# Patient Record
Sex: Male | Born: 1975 | Race: Black or African American | Hispanic: No | Marital: Single | Smoking: Never smoker
Health system: Southern US, Community
[De-identification: ages and names within clinical notes are randomized; demographics above are authoritative.]

## PROBLEM LIST (undated history)

## (undated) DIAGNOSIS — I10 Essential (primary) hypertension: Secondary | ICD-10-CM

## (undated) DIAGNOSIS — E669 Obesity, unspecified: Secondary | ICD-10-CM

## (undated) HISTORY — PX: TONSILECTOMY, ADENOIDECTOMY, BILATERAL MYRINGOTOMY AND TUBES: SHX2538

## (undated) HISTORY — DX: Obesity, unspecified: E66.9

---

## 2009-03-21 ENCOUNTER — Emergency Department (HOSPITAL_BASED_OUTPATIENT_CLINIC_OR_DEPARTMENT_OTHER): Admission: EM | Admit: 2009-03-21 | Discharge: 2009-03-21 | Payer: Self-pay | Admitting: Emergency Medicine

## 2009-03-25 ENCOUNTER — Emergency Department (HOSPITAL_BASED_OUTPATIENT_CLINIC_OR_DEPARTMENT_OTHER): Admission: EM | Admit: 2009-03-25 | Discharge: 2009-03-26 | Payer: Self-pay | Admitting: Emergency Medicine

## 2009-03-25 ENCOUNTER — Ambulatory Visit: Payer: Self-pay | Admitting: Diagnostic Radiology

## 2009-05-15 ENCOUNTER — Emergency Department (HOSPITAL_BASED_OUTPATIENT_CLINIC_OR_DEPARTMENT_OTHER): Admission: EM | Admit: 2009-05-15 | Discharge: 2009-05-15 | Payer: Self-pay | Admitting: Emergency Medicine

## 2009-05-15 ENCOUNTER — Ambulatory Visit: Payer: Self-pay | Admitting: Diagnostic Radiology

## 2013-02-14 ENCOUNTER — Telehealth: Payer: Self-pay

## 2013-02-14 NOTE — Telephone Encounter (Signed)
Left message for call back Non identifiable  

## 2013-02-14 NOTE — Telephone Encounter (Signed)
Medication List and allergies: entered  90 day supply/mail order: na Local prescriptions: [facility] [address]  Immunizations due: declines flu, tdap approx 8 years ago  A/P:   Updated to FH, PSH and patient hx  To Discuss with Provider: Check up/advised fasting for baseline labs Throat pain

## 2013-02-15 ENCOUNTER — Encounter: Payer: Self-pay | Admitting: Family Medicine

## 2013-02-15 ENCOUNTER — Ambulatory Visit (INDEPENDENT_AMBULATORY_CARE_PROVIDER_SITE_OTHER): Payer: BC Managed Care – PPO | Admitting: Family Medicine

## 2013-02-15 ENCOUNTER — Encounter: Payer: Self-pay | Admitting: General Practice

## 2013-02-15 VITALS — BP 140/84 | HR 88 | Temp 97.6°F | Resp 16 | Ht 62.75 in | Wt 184.5 lb

## 2013-02-15 DIAGNOSIS — R059 Cough, unspecified: Secondary | ICD-10-CM

## 2013-02-15 DIAGNOSIS — R030 Elevated blood-pressure reading, without diagnosis of hypertension: Secondary | ICD-10-CM

## 2013-02-15 DIAGNOSIS — R05 Cough: Secondary | ICD-10-CM

## 2013-02-15 LAB — LIPID PANEL
HDL: 32.3 mg/dL — ABNORMAL LOW (ref >39.00)
LDL Cholesterol: 110 mg/dL — ABNORMAL HIGH (ref 0–99)
Triglycerides: 52 mg/dL (ref 0.0–149.0)
VLDL: 10.4 mg/dL (ref 0.0–40.0)

## 2013-02-15 LAB — CBC WITH DIFFERENTIAL/PLATELET
Eosinophils Absolute: 0.1 10*3/uL (ref 0.0–0.7)
HCT: 41.7 % (ref 39.0–52.0)
Hemoglobin: 14 g/dL (ref 13.0–17.0)
MCV: 87.3 fl (ref 78.0–100.0)
Monocytes Absolute: 0.4 10*3/uL (ref 0.1–1.0)
Neutro Abs: 2.6 10*3/uL (ref 1.4–7.7)
Neutrophils Relative %: 51.8 % (ref 43.0–77.0)
RBC: 4.78 Mil/uL (ref 4.22–5.81)
RDW: 13.3 % (ref 11.5–14.6)
WBC: 5 10*3/uL (ref 4.5–10.5)

## 2013-02-15 LAB — BASIC METABOLIC PANEL
CO2: 26 mEq/L (ref 19–32)
Calcium: 8.6 mg/dL (ref 8.4–10.5)
Chloride: 105 mEq/L (ref 96–112)
Sodium: 138 mEq/L (ref 135–145)

## 2013-02-15 LAB — HEPATIC FUNCTION PANEL
ALT: 26 U/L (ref 0–53)
AST: 25 U/L (ref 0–37)
Bilirubin, Direct: 0 mg/dL (ref 0.0–0.3)

## 2013-02-15 LAB — TSH: TSH: 1.39 u[IU]/mL (ref 0.35–5.50)

## 2013-02-15 MED ORDER — FLUTICASONE PROPIONATE 50 MCG/ACT NA SUSP: 2.0000 | Freq: Every day | NASAL | Status: DC

## 2013-02-15 NOTE — Patient Instructions (Signed)
Schedule your complete physical in 6 months (we'll recheck the blood pressure at this time) We'll notify you of your lab results and make any changes if needed Start Claritin or Zyrtec daily Use the Flonase- 2 sprays each nostril daily- to decrease the drainage, the need to clear your throat and cough Drink plenty of fluids Limit your salt intake Get regular exercise Call with any questions or concerns Welcome!  We're glad to have you!

## 2013-02-15 NOTE — Assessment & Plan Note (Signed)
New.  Suspect this is due to PND.  Start OTC antihistamine and nasal steroid spray.  Reviewed supportive care and red flags that should prompt return.  Pt expressed understanding and is in agreement w/ plan.

## 2013-02-15 NOTE — Progress Notes (Signed)
  Subjective:    Patient ID: Chris Harrington, male    DOB: 20-Nov-1975, 37 y.o.   MRN: [id number]  HPI Pre visit review using our clinic review tool, if applicable. No additional management support is needed unless otherwise documented below in the visit note.  New to establish.  No previous MD.  Elevated BP- new to pt, 'i haven't really monitored it'.  Has not had insurance in 4-5 yrs.  Strong family hx of HTN.  No CP, SOB, HAs, visual changes, edema.  Pt will have fleeting episodes of dizziness/light headed.  No palpitations, no flushing or sweating.  Cough- sxs started 2 yrs ago.  'i always gotta clear my throat'.  Occuring at night too.  Cough is dry.     Review of Systems For ROS see HPI     Objective:   Physical Exam  Vitals reviewed. Constitutional: He appears well-developed and well-nourished. No distress.  HENT:  Head: Normocephalic and atraumatic.  No TTP over sinuses + turbinate edema + PND TMs normal bilaterally  Eyes: Conjunctivae and EOM are normal. Pupils are equal, round, and reactive to light.  Neck: Normal range of motion. Neck supple.  Cardiovascular: Normal rate, regular rhythm and normal heart sounds.   Pulmonary/Chest: Effort normal and breath sounds normal. No respiratory distress. He has no wheezes.  Abdominal: Soft. Bowel sounds are normal. He exhibits no distension. There is no tenderness. There is no rebound.  Musculoskeletal: He exhibits no edema.  Lymphadenopathy:    He has no cervical adenopathy.  Skin: Skin is warm and dry.          Assessment & Plan:

## 2013-02-15 NOTE — Assessment & Plan Note (Signed)
New.  Elevated today but not high enough to start meds.  Given strong family hx will monitor closely.  Encouraged low Na diet, weight loss, regular exercise.  Check labs.

## 2013-08-10 ENCOUNTER — Encounter: Payer: BC Managed Care – PPO | Admitting: Family Medicine

## 2013-08-10 DIAGNOSIS — Z0289 Encounter for other administrative examinations: Secondary | ICD-10-CM

## 2014-01-01 ENCOUNTER — Emergency Department (HOSPITAL_BASED_OUTPATIENT_CLINIC_OR_DEPARTMENT_OTHER)
Admission: EM | Admit: 2014-01-01 | Discharge: 2014-01-01 | Disposition: A | Discharge status: Home / Self Care | Payer: BC Managed Care – PPO | Attending: Emergency Medicine | Admitting: Emergency Medicine

## 2014-01-01 ENCOUNTER — Encounter (HOSPITAL_BASED_OUTPATIENT_CLINIC_OR_DEPARTMENT_OTHER): Payer: Self-pay | Admitting: Emergency Medicine

## 2014-01-01 DIAGNOSIS — R5381 Other malaise: Secondary | ICD-10-CM | POA: Diagnosis not present

## 2014-01-01 DIAGNOSIS — IMO0001 Reserved for inherently not codable concepts without codable children: Secondary | ICD-10-CM

## 2014-01-01 DIAGNOSIS — I10 Essential (primary) hypertension: Secondary | ICD-10-CM | POA: Diagnosis not present

## 2014-01-01 DIAGNOSIS — J3489 Other specified disorders of nose and nasal sinuses: Secondary | ICD-10-CM | POA: Diagnosis present

## 2014-01-01 DIAGNOSIS — J069 Acute upper respiratory infection, unspecified: Secondary | ICD-10-CM | POA: Diagnosis not present

## 2014-01-01 DIAGNOSIS — R030 Elevated blood-pressure reading, without diagnosis of hypertension: Secondary | ICD-10-CM

## 2014-01-01 DIAGNOSIS — IMO0002 Reserved for concepts with insufficient information to code with codable children: Secondary | ICD-10-CM | POA: Insufficient documentation

## 2014-01-01 DIAGNOSIS — R5383 Other fatigue: Secondary | ICD-10-CM

## 2014-01-01 MED ORDER — IBUPROFEN 400 MG PO TABS
600.0000 mg | ORAL_TABLET | Freq: Once | ORAL | Status: AC
  Administered 2014-01-01: 600 mg via ORAL
  Filled 2014-01-01 (×2): qty 1

## 2014-01-01 MED ORDER — BENZONATATE 100 MG PO CAPS: 100.0000 mg | ORAL_CAPSULE | Freq: Three times a day (TID) | ORAL | Status: DC

## 2014-01-01 MED ORDER — FLUTICASONE PROPIONATE 50 MCG/ACT NA SUSP: 2.0000 | Freq: Every day | NASAL | Status: DC

## 2014-01-01 MED ORDER — OXYMETAZOLINE HCL 0.05 % NA SOLN
2.0000 | Freq: Two times a day (BID) | NASAL | Status: DC | PRN
  Administered 2014-01-01: 2 via NASAL
  Filled 2014-01-01: qty 15

## 2014-01-01 NOTE — ED Provider Notes (Signed)
CSN: [id number]     Arrival date & time 01/01/14  0359 History   First MD Initiated Contact with Patient 01/01/14 [phone]     Chief Complaint  Patient presents with  . Nasal Congestion     (Consider location/radiation/quality/duration/timing/severity/associated sxs/prior Treatment) HPI Patient presents with 24 hours of nasal congestion, sinus pressure, sore throat, left ear pain and right eye redness. No fever or chills. No known sick contacts. Patient has no vision disturbance or photophobia. Mild pain behind the right eye. Patient has no neck stiffness. Patient states had a mild cough without sputum production. He has no shortness of breath. No fever chills. No focal weakness or numbness. Patient with no history of elevated blood pressure. Has been taking over-the-counter cold medication with little relief. History reviewed. No pertinent past medical history. Past Surgical History  Procedure Laterality Date  . Tonsilectomy, adenoidectomy, bilateral myringotomy and tubes      pt has not had tubes in ears   Family History  Problem Relation Age of Onset  . Hypertension Mother   . Hypertension Father   . Cancer Paternal Grandmother     passed from unknown cancer  . Hypertension Maternal Grandmother   . Hypertension Maternal Grandfather    History  Substance Use Topics  . Smoking status: Never Smoker   . Smokeless tobacco: Never Used  . Alcohol Use: Yes    Review of Systems  Constitutional: Positive for fatigue. Negative for fever and chills.  HENT: Positive for congestion, ear pain, rhinorrhea, sinus pressure and sore throat.   Eyes: Positive for redness. Negative for photophobia, discharge and visual disturbance.  Respiratory: Positive for cough. Negative for shortness of breath.   Cardiovascular: Negative for chest pain.  Gastrointestinal: Negative for nausea, vomiting and abdominal pain.  Musculoskeletal: Negative for back pain, neck pain and neck stiffness.  Skin: Negative  for rash.  Neurological: Negative for dizziness, weakness, light-headedness, numbness and headaches.  All other systems reviewed and are negative.     Allergies  Review of patient's allergies indicates no known allergies.  Home Medications   Prior to Admission medications   Medication Sig Start Date End Date Taking? Authorizing Provider  benzonatate (TESSALON) 100 MG capsule Take 1 capsule (100 mg total) by mouth every 8 (eight) hours. 01/01/14   Loren Raceravid Edmond Ginsberg, MD  fluticasone (FLONASE) 50 MCG/ACT nasal spray Place 2 sprays into both nostrils daily. 02/15/13   Sheliah HatchKatherine E Tabori, MD  fluticasone (FLONASE) 50 MCG/ACT nasal spray Place 2 sprays into both nostrils daily. 01/01/14   Loren Raceravid Glema Takaki, MD   BP 171/111  Pulse 88  Temp(Src) 99.2 F (37.3 C) (Oral)  Resp 20  Ht 5\' 2"  (1.575 m)  Wt 186 lb (84.369 kg)  BMI 34.01 kg/m2  SpO2 98% Physical Exam  Nursing note and vitals reviewed. Constitutional: He is oriented to person, place, and time. He appears well-developed and well-nourished. No distress.  HENT:  Head: Normocephalic and atraumatic.  Mouth/Throat: Oropharynx is clear and moist.  Bilateral nasal mucosal edema. Diffuse sinus tenderness with percussion. Bilateral TMs without evidence of erythema. No definite tonsillar exudate  Eyes: EOM are normal. Pupils are equal, round, and reactive to light.  Injected right conjunctiva. No foreign bodies. No photophobia.. No consensual photophobia. No hyphema  Neck: Normal range of motion. Neck supple.  No meningismus  Cardiovascular: Normal rate and regular rhythm.   Pulmonary/Chest: Effort normal and breath sounds normal. No respiratory distress. He has no wheezes. He has no rales.  Abdominal: Soft.  Bowel sounds are normal. He exhibits no distension and no mass. There is no tenderness. There is no rebound and no guarding.  Musculoskeletal: Normal range of motion. He exhibits no edema and no tenderness.  Neurological: He is alert  and oriented to person, place, and time.  Moves all extremities without deficit. Sensation is intact. ambulatory  Skin: Skin is warm and dry. No rash noted. No erythema.  Psychiatric: He has a normal mood and affect. His behavior is normal.    ED Course  Procedures (including critical care time) Labs Review Labs Reviewed - No data to display  Imaging Review No results found.   EKG Interpretation None      MDM   Final diagnoses:  URI (upper respiratory infection)  Elevated blood pressure    Likely viral with URI. We'll treat symptomatically. Patient is advised to follow with his primary Dr. regarding his elevated blood pressure. He's been given return precautions and has voice understanding.    Loren Racer, MD 01/01/14 0530

## 2014-01-01 NOTE — ED Notes (Signed)
Pt c/o head congestion with nasal drainage, sore throat, lt ear pain, rt eye redness

## 2014-01-01 NOTE — Discharge Instructions (Signed)
Take medication as prescribed. Drink plenty of fluids. Call and make appointment to followup with your primary Dr. regarding her blood pressure and to assure resolution of your symptoms. If your symptoms worsen, you have difficulty breathing, fever or any concerns return to the emergency department.  Upper Respiratory Infection, Adult An upper respiratory infection (URI) is also known as the common cold. It is often caused by a type of germ (virus). Colds are easily spread (contagious). You can pass it to others by kissing, coughing, sneezing, or drinking out of the same glass. Usually, you get better in 1 or 2 weeks.  HOME CARE   Only take medicine as told by your doctor.  Use a warm mist humidifier or breathe in steam from a hot shower.  Drink enough water and fluids to keep your pee (urine) clear or pale yellow.  Get plenty of rest.  Return to work when your temperature is back to normal or as told by your doctor. You may use a face mask and wash your hands to stop your cold from spreading. GET HELP RIGHT AWAY IF:   After the first few days, you feel you are getting worse.  You have questions about your medicine.  You have chills, shortness of breath, or brown or red spit (mucus).  You have yellow or brown snot (nasal discharge) or pain in the face, especially when you bend forward.  You have a fever, puffy (swollen) neck, pain when you swallow, or white spots in the back of your throat.  You have a bad headache, ear pain, sinus pain, or chest pain.  You have a high-pitched whistling sound when you breathe in and out (wheezing).  You have a lasting cough or cough up blood.  You have sore muscles or a stiff neck. MAKE SURE YOU:   Understand these instructions.  Will watch your condition.  Will get help right away if you are not doing well or get worse. Document Released: 09/08/2007 Document Revised: 06/14/2011 Document Reviewed: 06/27/2013 [facility] Patient Information  2015 [place], [place]. This information is not intended to replace advice given to you by your health care provider. Make sure you discuss any questions you have with your health care provider.  Hypertension Hypertension, commonly called high blood pressure, is when the force of blood pumping through your arteries is too strong. Your arteries are the blood vessels that carry blood from your heart throughout your body. A blood pressure reading consists of a higher number over a lower number, such as 110/72. The higher number (systolic) is the pressure inside your arteries when your heart pumps. The lower number (diastolic) is the pressure inside your arteries when your heart relaxes. Ideally you want your blood pressure below 120/80. Hypertension forces your heart to work harder to pump blood. Your arteries may become narrow or stiff. Having hypertension puts you at risk for heart disease, stroke, and other problems.  RISK FACTORS Some risk factors for high blood pressure are controllable. Others are not.  Risk factors you cannot control include:   Race. You may be at higher risk if you are African American.  Age. Risk increases with age.  Gender. Men are at higher risk than women before age 31 years. After age 6, women are at higher risk than men. Risk factors you can control include:  Not getting enough exercise or physical activity.  Being overweight.  Getting too much fat, sugar, calories, or salt in your diet.  Drinking too much alcohol. SIGNS AND  SYMPTOMS Hypertension does not usually cause signs or symptoms. Extremely high blood pressure (hypertensive crisis) may cause headache, anxiety, shortness of breath, and nosebleed. DIAGNOSIS  To check if you have hypertension, your health care provider will measure your blood pressure while you are seated, with your arm held at the level of your heart. It should be measured at least twice using the same arm. Certain conditions can cause a  difference in blood pressure between your right and left arms. A blood pressure reading that is higher than normal on one occasion does not mean that you need treatment. If one blood pressure reading is high, ask your health care provider about having it checked again. TREATMENT  Treating high blood pressure includes making lifestyle changes and possibly taking medicine. Living a healthy lifestyle can help lower high blood pressure. You may need to change some of your habits. Lifestyle changes may include:  Following the DASH diet. This diet is high in fruits, vegetables, and whole grains. It is low in salt, red meat, and added sugars.  Getting at least 2 hours of brisk physical activity every week.  Losing weight if necessary.  Not smoking.  Limiting alcoholic beverages.  Learning ways to reduce stress. If lifestyle changes are not enough to get your blood pressure under control, your health care provider may prescribe medicine. You may need to take more than one. Work closely with your health care provider to understand the risks and benefits. HOME CARE INSTRUCTIONS  Have your blood pressure rechecked as directed by your health care provider.   Take medicines only as directed by your health care provider. Follow the directions carefully. Blood pressure medicines must be taken as prescribed. The medicine does not work as well when you skip doses. Skipping doses also puts you at risk for problems.   Do not smoke.   Monitor your blood pressure at home as directed by your health care provider. SEEK MEDICAL CARE IF:   You think you are having a reaction to medicines taken.  You have recurrent headaches or feel dizzy.  You have swelling in your ankles.  You have trouble with your vision. SEEK IMMEDIATE MEDICAL CARE IF:  You develop a severe headache or confusion.  You have unusual weakness, numbness, or feel faint.  You have severe chest or abdominal pain.  You vomit  repeatedly.  You have trouble breathing. MAKE SURE YOU:   Understand these instructions.  Will watch your condition.  Will get help right away if you are not doing well or get worse. Document Released: 03/22/2005 Document Revised: 08/06/2013 Document Reviewed: 01/12/2013 [facility] Patient Information 2015 [place]. This information is not intended to replace advice given to you by your health care provider. Make sure you discuss any questions you have with your health care provider.

## 2014-01-09 ENCOUNTER — Encounter: Payer: Self-pay | Admitting: Family Medicine

## 2014-01-09 ENCOUNTER — Ambulatory Visit (INDEPENDENT_AMBULATORY_CARE_PROVIDER_SITE_OTHER): Payer: BC Managed Care – PPO | Admitting: Family Medicine

## 2014-01-09 VITALS — BP 140/80 | HR 82 | Temp 98.2°F | Resp 16 | Wt 190.0 lb

## 2014-01-09 DIAGNOSIS — R030 Elevated blood-pressure reading, without diagnosis of hypertension: Secondary | ICD-10-CM

## 2014-01-09 DIAGNOSIS — IMO0001 Reserved for inherently not codable concepts without codable children: Secondary | ICD-10-CM

## 2014-01-09 NOTE — Patient Instructions (Signed)
Schedule your complete physical in 3 months (we'll recheck your BP at this time) Drink plenty of water Try and limit your salt intake- this includes eating out and processed meats (bacon, pepperoni, sausage, etc) Try and get regular exercise We'll notify you of your lab results and make any changes if needed Call with any questions or concerns Hang in there!!!

## 2014-01-09 NOTE — Progress Notes (Signed)
Pre visit review using our clinic review tool, if applicable. No additional management support is needed unless otherwise documented below in the visit note. 

## 2014-01-09 NOTE — Progress Notes (Signed)
   Subjective:    Patient ID: Chris Harrington, male    DOB: 10/13/1975, 38 y.o.   MRN: [id number]  HPI ER f/u- pt was seen on 9/29 and dx'd w/ URI.  BP was noted to be 171/111.  Pt reports when he was seen in ER was taking OTC Alka-Seltzer cold meds.  Today BP is 140/80.  Pt reports feeling better.  No CP, SOB, + HAs.  No blurry vision, edema.  + family hx of HTN- both mom and dad.  Pt is trying to change diet- limiting soda, increased water.  Pt denies cooking w/ salt but eats pizza 3x/week and eating out regularly.   Review of Systems For ROS see HPI     Objective:   Physical Exam  Vitals reviewed. Constitutional: He is oriented to person, place, and time. He appears well-developed and well-nourished. No distress.  HENT:  Head: Normocephalic and atraumatic.  Eyes: Conjunctivae and EOM are normal. Pupils are equal, round, and reactive to light.  Neck: Normal range of motion. Neck supple. No thyromegaly present.  Cardiovascular: Normal rate, regular rhythm, normal heart sounds and intact distal pulses.   No murmur heard. Pulmonary/Chest: Effort normal and breath sounds normal. No respiratory distress.  Abdominal: Soft. Bowel sounds are normal. He exhibits no distension.  Musculoskeletal: He exhibits no edema.  Lymphadenopathy:    He has no cervical adenopathy.  Neurological: He is alert and oriented to person, place, and time. No cranial nerve deficit.  Skin: Skin is warm and dry.  Psychiatric: He has a normal mood and affect. His behavior is normal.          Assessment & Plan:

## 2014-01-09 NOTE — Assessment & Plan Note (Signed)
Pt's recent readings in ER were likely due to use of OTC cough/cold meds.  Pt does have strong family hx of HTN.  Admits to eating out regularly and not exercising.  Reviewed lifestyle modifications- pt would like to try this prior to starting medication.  Will follow closely and start meds in the future as needed.  Pt expressed understanding and is in agreement w/ plan.

## 2014-01-10 LAB — CBC WITH DIFFERENTIAL/PLATELET
BASOS ABS: 0 10*3/uL (ref 0.0–0.1)
BASOS PCT: 0.6 % (ref 0.0–3.0)
EOS ABS: 0.2 10*3/uL (ref 0.0–0.7)
EOS PCT: 4.7 % (ref 0.0–5.0)
HEMATOCRIT: 41.4 % (ref 39.0–52.0)
Hemoglobin: 13.9 g/dL (ref 13.0–17.0)
LYMPHS PCT: 36.9 % (ref 12.0–46.0)
Lymphs Abs: 1.9 10*3/uL (ref 0.7–4.0)
MCHC: 33.6 g/dL (ref 30.0–36.0)
MCV: 88.4 fl (ref 78.0–100.0)
MONOS PCT: 6.5 % (ref 3.0–12.0)
Monocytes Absolute: 0.3 10*3/uL (ref 0.1–1.0)
Neutro Abs: 2.6 10*3/uL (ref 1.4–7.7)
Neutrophils Relative %: 51.3 % (ref 43.0–77.0)
PLATELETS: 258 10*3/uL (ref 150.0–400.0)
RBC: 4.69 Mil/uL (ref 4.22–5.81)
RDW: 13.1 % (ref 11.5–15.5)
WBC: 5.2 10*3/uL (ref 4.0–10.5)

## 2014-01-10 LAB — TSH: TSH: 1.87 u[IU]/mL (ref 0.35–4.50)

## 2014-01-10 LAB — BASIC METABOLIC PANEL
BUN: 15 mg/dL (ref 6–23)
CO2: 31 mEq/L (ref 19–32)
CREATININE: 1.3 mg/dL (ref 0.4–1.5)
Calcium: 9 mg/dL (ref 8.4–10.5)
Chloride: 103 mEq/L (ref 96–112)
GFR: 84.01 mL/min (ref >60.00)
Glucose, Bld: 93 mg/dL (ref 70–99)
POTASSIUM: 3.8 meq/L (ref 3.5–5.1)
SODIUM: 139 meq/L (ref 135–145)

## 2014-01-11 ENCOUNTER — Encounter: Payer: Self-pay | Admitting: General Practice

## 2014-04-17 ENCOUNTER — Ambulatory Visit: Payer: BLUE CROSS/BLUE SHIELD | Admitting: Family Medicine

## 2014-10-25 ENCOUNTER — Emergency Department (HOSPITAL_BASED_OUTPATIENT_CLINIC_OR_DEPARTMENT_OTHER): Payer: Self-pay

## 2014-10-25 ENCOUNTER — Telehealth: Payer: Self-pay | Admitting: Family Medicine

## 2014-10-25 ENCOUNTER — Emergency Department (HOSPITAL_BASED_OUTPATIENT_CLINIC_OR_DEPARTMENT_OTHER)
Admission: EM | Admit: 2014-10-25 | Discharge: 2014-10-25 | Disposition: A | Discharge status: Home / Self Care | Payer: Self-pay | Attending: Emergency Medicine | Admitting: Emergency Medicine

## 2014-10-25 ENCOUNTER — Encounter (HOSPITAL_BASED_OUTPATIENT_CLINIC_OR_DEPARTMENT_OTHER): Payer: Self-pay

## 2014-10-25 ENCOUNTER — Ambulatory Visit: Payer: Self-pay | Admitting: Family Medicine

## 2014-10-25 DIAGNOSIS — Z7951 Long term (current) use of inhaled steroids: Secondary | ICD-10-CM | POA: Insufficient documentation

## 2014-10-25 DIAGNOSIS — N200 Calculus of kidney: Secondary | ICD-10-CM | POA: Insufficient documentation

## 2014-10-25 DIAGNOSIS — I10 Essential (primary) hypertension: Secondary | ICD-10-CM | POA: Insufficient documentation

## 2014-10-25 DIAGNOSIS — R109 Unspecified abdominal pain: Secondary | ICD-10-CM

## 2014-10-25 HISTORY — DX: Essential (primary) hypertension: I10

## 2014-10-25 LAB — URINALYSIS, ROUTINE W REFLEX MICROSCOPIC
Bilirubin Urine: NEGATIVE
GLUCOSE, UA: NEGATIVE mg/dL
Ketones, ur: NEGATIVE mg/dL
Leukocytes, UA: NEGATIVE
NITRITE: NEGATIVE
PH: 5.5 (ref 5.0–8.0)
PROTEIN: 30 mg/dL — AB
SPECIFIC GRAVITY, URINE: 1.025 (ref 1.005–1.030)
Urobilinogen, UA: 0.2 mg/dL (ref 0.0–1.0)

## 2014-10-25 LAB — URINE MICROSCOPIC-ADD ON

## 2014-10-25 MED ORDER — TAMSULOSIN HCL 0.4 MG PO CAPS: 0.4000 mg | ORAL_CAPSULE | Freq: Every day | ORAL | Status: DC

## 2014-10-25 MED ORDER — OXYCODONE-ACETAMINOPHEN 5-325 MG PO TABS: 1.0000 | ORAL_TABLET | Freq: Four times a day (QID) | ORAL | Status: DC | PRN

## 2014-10-25 MED ORDER — KETOROLAC TROMETHAMINE 60 MG/2ML IM SOLN
60.0000 mg | Freq: Once | INTRAMUSCULAR | Status: AC
  Administered 2014-10-25: 60 mg via INTRAMUSCULAR
  Filled 2014-10-25: qty 2

## 2014-10-25 NOTE — ED Provider Notes (Deleted)
CSN: [id number]     Arrival date & time 10/25/14  2206 History   First MD Initiated Contact with Patient 10/25/14 2218     Chief Complaint  Patient presents with  . Back Pain     (Consider location/radiation/quality/duration/timing/severity/associated sxs/prior Treatment) HPI  Past Medical History  Diagnosis Date  . Hypertension    Past Surgical History  Procedure Laterality Date  . Tonsilectomy, adenoidectomy, bilateral myringotomy and tubes      pt has not had tubes in ears   Family History  Problem Relation Age of Onset  . Hypertension Mother   . Hypertension Father   . Cancer Paternal Grandmother     passed from unknown cancer  . Hypertension Maternal Grandmother   . Hypertension Maternal Grandfather    History  Substance Use Topics  . Smoking status: Never Smoker   . Smokeless tobacco: Never Used  . Alcohol Use: Yes    Review of Systems    Allergies  Review of patient's allergies indicates no known allergies.  Home Medications   Prior to Admission medications   Medication Sig Start Date End Date Taking? Authorizing Provider  benzonatate (TESSALON) 100 MG capsule Take 1 capsule (100 mg total) by mouth every 8 (eight) hours. 01/01/14   Loren Racer, MD  fluticasone (FLONASE) 50 MCG/ACT nasal spray Place 2 sprays into both nostrils daily. 01/01/14   Loren Racer, MD   BP 174/105 mmHg  Pulse 84  Temp(Src) 98.3 F (36.8 C) (Oral)  Resp 20  Ht  (1.575 m)  Wt 175 lb (79.379 kg)  BMI 32.00 kg/m2  SpO2 100% Physical Exam  ED Course  Procedures (including critical care time) Labs Review Labs Reviewed  URINALYSIS, ROUTINE W REFLEX MICROSCOPIC (NOT AT [facility]) - Abnormal; Notable for the following:    Hgb urine dipstick LARGE (*)    Protein, ur 30 (*)    All other components within normal limits  URINE MICROSCOPIC-ADD ON - Abnormal; Notable for the following:    Casts HYALINE CASTS (*)    Crystals URIC ACID CRYSTALS (*)    All other components  within normal limits    Imaging Review No results found.   EKG Interpretation None      MDM   Final diagnoses:  Right flank pain    Urinalysis shows blood and CT scan confirms a moderate sized calculus in the mid ureter with hydronephrosis. He will be treated with Flomax, pain medication, and follow-up with urology if not improving by Monday.    Geoffery Lyons, MD 10/25/14 2306

## 2014-10-25 NOTE — ED Notes (Signed)
Pt c/o lower back/flank pain x3 days with some lower abdominal pain, blood in urine;

## 2014-10-25 NOTE — Discharge Instructions (Signed)
Flomax as prescribed.  Percocet as prescribed as needed for pain.  Call [facility] to schedule a follow-up appointment if you're not improving by Monday. The contact information has been provided in this discharge summary.   Kidney Stones Kidney stones (urolithiasis) are deposits that form inside your kidneys. The intense pain is caused by the stone moving through the urinary tract. When the stone moves, the ureter goes into spasm around the stone. The stone is usually passed in the urine.  CAUSES   A disorder that makes certain neck glands produce too much parathyroid hormone (primary hyperparathyroidism).  A buildup of uric acid crystals, similar to gout in your joints.  Narrowing (stricture) of the ureter.  A kidney obstruction present at birth (congenital obstruction).  Previous surgery on the kidney or ureters.  Numerous kidney infections. SYMPTOMS   Feeling sick to your stomach (nauseous).  Throwing up (vomiting).  Blood in the urine (hematuria).  Pain that usually spreads (radiates) to the groin.  Frequency or urgency of urination. DIAGNOSIS   Taking a history and physical exam.  Blood or urine tests.  CT scan.  Occasionally, an examination of the inside of the urinary bladder (cystoscopy) is performed. TREATMENT   Observation.  Increasing your fluid intake.  Extracorporeal shock wave lithotripsy--This is a noninvasive procedure that uses shock waves to break up kidney stones.  Surgery may be needed if you have severe pain or persistent obstruction. There are various surgical procedures. Most of the procedures are performed with the use of small instruments. Only small incisions are needed to accommodate these instruments, so recovery time is minimized. The size, location, and chemical composition are all important variables that will determine the proper choice of action for you. Talk to your health care provider to better understand your situation so  that you will minimize the risk of injury to yourself and your kidney.  HOME CARE INSTRUCTIONS   Drink enough water and fluids to keep your urine clear or pale yellow. This will help you to pass the stone or stone fragments.  Strain all urine through the provided strainer. Keep all particulate matter and stones for your health care provider to see. The stone causing the pain may be as small as a grain of salt. It is very important to use the strainer each and every time you pass your urine. The collection of your stone will allow your health care provider to analyze it and verify that a stone has actually passed. The stone analysis will often identify what you can do to reduce the incidence of recurrences.  Only take over-the-counter or prescription medicines for pain, discomfort, or fever as directed by your health care provider.  Make a follow-up appointment with your health care provider as directed.  Get follow-up X-rays if required. The absence of pain does not always mean that the stone has passed. It may have only stopped moving. If the urine remains completely obstructed, it can cause loss of kidney function or even complete destruction of the kidney. It is your responsibility to make sure X-rays and follow-ups are completed. Ultrasounds of the kidney can show blockages and the status of the kidney. Ultrasounds are not associated with any radiation and can be performed easily in a matter of minutes. SEEK MEDICAL CARE IF:  You experience pain that is progressive and unresponsive to any pain medicine you have been prescribed. SEEK IMMEDIATE MEDICAL CARE IF:   Pain cannot be controlled with the prescribed medicine.  You have a  fever or shaking chills.  The severity or intensity of pain increases over 18 hours and is not relieved by pain medicine.  You develop a new onset of abdominal pain.  You feel faint or pass out.  You are unable to urinate. MAKE SURE YOU:   Understand these  instructions.  Will watch your condition.  Will get help right away if you are not doing well or get worse. Document Released: 03/22/2005 Document Revised: 11/22/2012 Document Reviewed: 08/23/2012 [facility] Patient Information 2015 [place], [place]. This information is not intended to replace advice given to you by your health care provider. Make sure you discuss any questions you have with your health care provider.

## 2014-10-25 NOTE — ED Provider Notes (Signed)
CSN: [id number]     Arrival date & time 10/25/14  2206 History  This chart was scribed for Geoffery Lyons, MD by Lyndel Safe, ED Scribe. This patient was seen in room MH06/MH06 and the patient's care was started 10:18 PM.   Chief Complaint  Patient presents with  . Back Pain   The history is provided by the patient. No language interpreter was used.   HPI Comments: Chris Harrington is a 39 y.o. male, with no pertinent PMhx, who presents to the Emergency Department complaining of sudden onset, constant, moderate generalized lower back pain that is worse on the right side onset 3 days. He states the pain is exacerbated with any type of movement. 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 He stopped at an Urgent Care in [place] 2 days ago and received a shot of Toradol which he states alleviated his back pain mildly for a day. He additionally notes he received diagnostic imaging and a urinalysis 2 weeks ago at an Urgent Care [place] of state due to hematuria when he was diagnosed with prostatitis and a UTI. Pt was started on an antibiotic course. Denies any attributable injury, dysuria, radiation of pain into BLE, or bowel or bladder incontinence.   Past Medical History  Diagnosis Date  . Hypertension    Past Surgical History  Procedure Laterality Date  . Tonsilectomy, adenoidectomy, bilateral myringotomy and tubes      pt has not had tubes in ears   Family History  Problem Relation Age of Onset  . Hypertension Mother   . Hypertension Father   . Cancer Paternal Grandmother     passed from unknown cancer  . Hypertension Maternal Grandmother   . Hypertension Maternal Grandfather    History  Substance Use Topics  . Smoking status: Never Smoker   . Smokeless tobacco: Never Used  . Alcohol Use: Yes    Review of Systems  Genitourinary: Negative for dysuria.  Musculoskeletal: Positive for back pain. Negative for myalgias and arthralgias.  A complete 10 system review of systems was  obtained and is otherwise negative except at noted in the HPI and PMH.  Allergies  Review of patient's allergies indicates no known allergies.  Home Medications   Prior to Admission medications   Medication Sig Start Date End Date Taking? Authorizing Provider  benzonatate (TESSALON) 100 MG capsule Take 1 capsule (100 mg total) by mouth every 8 (eight) hours. 01/01/14   Loren Racer, MD  fluticasone (FLONASE) 50 MCG/ACT nasal spray Place 2 sprays into both nostrils daily. 01/01/14   Loren Racer, MD   BP 174/105 mmHg  Pulse 84  Temp(Src) 98.3 F (36.8 C) (Oral)  Resp 20  Ht  (1.575 m)  Wt 175 lb (79.379 kg)  BMI 32.00 kg/m2  SpO2 100% Physical Exam  Constitutional: He is oriented to person, place, and time. He appears well-developed and well-nourished. No distress.  HENT:  Head: Normocephalic.  Eyes: Conjunctivae are normal. Right eye exhibits no discharge. Left eye exhibits no discharge. No scleral icterus.  Neck: No JVD present.  Pulmonary/Chest: Effort normal. No respiratory distress.  Musculoskeletal: Normal range of motion. He exhibits tenderness.  TTP in the soft tissues of the right lower lumbar region.  Neurological: He is alert and oriented to person, place, and time. He has normal reflexes. No cranial nerve deficit. Coordination normal.  DTRs are 1+ and symmetrical in BLE; strength is 5/5 in the BLE; walks on heels and toes without difficulty.  Skin: Skin  is warm. No rash noted. No erythema. No pallor.  Psychiatric: He has a normal mood and affect. His behavior is normal.  Nursing note and vitals reviewed.   ED Course  Procedures  DIAGNOSTIC STUDIES: Oxygen Saturation is 100% on RA, normal by my interpretation.    COORDINATION OF CARE: 10:25 PM Discussed treatment plan which includes to order CT renal stone study and urinalysis with pt. Will also order Toradol injection. Pt acknowledges and agrees to plan.   Labs Review Labs Reviewed  URINALYSIS,  ROUTINE W REFLEX MICROSCOPIC (NOT AT [facility]) - Abnormal; Notable for the following:    Hgb urine dipstick LARGE (*)    Protein, ur 30 (*)    All other components within normal limits  URINE MICROSCOPIC-ADD ON - Abnormal; Notable for the following:    Casts HYALINE CASTS (*)    Crystals URIC ACID CRYSTALS (*)    All other components within normal limits    Imaging Review No results found.   EKG Interpretation None      MDM   Final diagnoses:  Right flank pain     Urinalysis shows blood and CT scan confirms a moderate sized calculus in the mid ureter with hydronephrosis. He will be treated with Flomax, pain medication, and follow-up with urology if not improving by Monday.  I personally performed the services described in this documentation, which was scribed in my presence. The recorded information has been reviewed and is accurate.      Geoffery Lyons, MD 11/07/14 1556

## 2014-10-25 NOTE — ED Notes (Signed)
MD at bedside. 

## 2014-10-28 ENCOUNTER — Ambulatory Visit: Payer: BLUE CROSS/BLUE SHIELD | Admitting: Family Medicine

## 2014-11-01 NOTE — Telephone Encounter (Signed)
Pt was no show 10/25/14 2:15pm, acute appt, 3rd no show per history, pt self pay per appt notes.  Do you want me to reschedule? Do you want to charge no show?

## 2014-11-04 NOTE — Telephone Encounter (Signed)
Added comment to chart to not reschedule pt. Do you need to update differently???

## 2014-11-04 NOTE — Telephone Encounter (Signed)
Do not reschedule, ok for NS.

## 2015-02-22 ENCOUNTER — Encounter (HOSPITAL_BASED_OUTPATIENT_CLINIC_OR_DEPARTMENT_OTHER): Payer: Self-pay | Admitting: Emergency Medicine

## 2015-02-22 ENCOUNTER — Emergency Department (HOSPITAL_BASED_OUTPATIENT_CLINIC_OR_DEPARTMENT_OTHER): Payer: Self-pay

## 2015-02-22 ENCOUNTER — Emergency Department (HOSPITAL_BASED_OUTPATIENT_CLINIC_OR_DEPARTMENT_OTHER)
Admission: EM | Admit: 2015-02-22 | Discharge: 2015-02-23 | Disposition: A | Discharge status: Home / Self Care | Payer: Self-pay | Attending: Emergency Medicine | Admitting: Emergency Medicine

## 2015-02-22 DIAGNOSIS — R51 Headache: Secondary | ICD-10-CM | POA: Insufficient documentation

## 2015-02-22 DIAGNOSIS — R200 Anesthesia of skin: Secondary | ICD-10-CM | POA: Insufficient documentation

## 2015-02-22 DIAGNOSIS — I10 Essential (primary) hypertension: Secondary | ICD-10-CM | POA: Insufficient documentation

## 2015-02-22 DIAGNOSIS — Z7951 Long term (current) use of inhaled steroids: Secondary | ICD-10-CM | POA: Insufficient documentation

## 2015-02-22 DIAGNOSIS — Z79899 Other long term (current) drug therapy: Secondary | ICD-10-CM | POA: Insufficient documentation

## 2015-02-22 DIAGNOSIS — R519 Headache, unspecified: Secondary | ICD-10-CM

## 2015-02-22 DIAGNOSIS — R110 Nausea: Secondary | ICD-10-CM | POA: Insufficient documentation

## 2015-02-22 LAB — CBC WITH DIFFERENTIAL/PLATELET
BASOS PCT: 1 %
Basophils Absolute: 0.1 10*3/uL (ref 0.0–0.1)
EOS ABS: 0.2 10*3/uL (ref 0.0–0.7)
Eosinophils Relative: 3 %
HEMATOCRIT: 42.5 % (ref 39.0–52.0)
HEMOGLOBIN: 14.4 g/dL (ref 13.0–17.0)
LYMPHS ABS: 1.9 10*3/uL (ref 0.7–4.0)
LYMPHS PCT: 35 %
MCH: 29 pg (ref 26.0–34.0)
MCHC: 33.9 g/dL (ref 30.0–36.0)
MCV: 85.7 fL (ref 78.0–100.0)
MONOS PCT: 9 %
Monocytes Absolute: 0.5 10*3/uL (ref 0.1–1.0)
Neutro Abs: 2.8 10*3/uL (ref 1.7–7.7)
Neutrophils Relative %: 52 %
Platelets: 249 10*3/uL (ref 150–400)
RBC: 4.96 MIL/uL (ref 4.22–5.81)
RDW: 12.7 % (ref 11.5–15.5)
WBC: 5.3 10*3/uL (ref 4.0–10.5)

## 2015-02-22 MED ORDER — CLONIDINE HCL 0.1 MG PO TABS
0.2000 mg | ORAL_TABLET | Freq: Once | ORAL | Status: AC
  Administered 2015-02-22: 0.2 mg via ORAL
  Filled 2015-02-22: qty 2

## 2015-02-22 NOTE — ED Provider Notes (Signed)
CSN: [id number]   Arrival date & time 02/22/15 2301  History  By signing my name below, I, Chris Harrington, attest that this documentation has been prepared under the direction and in the presence of Geoffery Lyonsouglas Steadman Prosperi, MD. Electronically Signed: Bethel BornBritney Harrington, ED Scribe. 02/22/2015. 11:31 PM.  Chief Complaint  Patient presents with  . Headache    HPI The history is provided by the patient. No language interpreter was used.   Chris Harrington is a 39 y.o. male with history of HTN who presents to the Emergency Department complaining of an atraumatic, constant, gradually worsening, 10/10, left-sided headache with gradual onset this morning around 8 AM. Excedrin provided insufficient pain relief at home. He had a similar but less severe headache in the past associated with hypertension but is not on an antihypertensive. Associated symptoms include tingling in the left arm/hand that is atypical of previous headaches and nausea. Pt denies weakness, vision change, neck pain, and vomiting. He has no history of DM, cardiac disease, or kidney disease.   Past Medical History  Diagnosis Date  . Hypertension     Past Surgical History  Procedure Laterality Date  . Tonsilectomy, adenoidectomy, bilateral myringotomy and tubes      pt has not had tubes in ears    Family History  Problem Relation Age of Onset  . Hypertension Mother   . Hypertension Father   . Cancer Paternal Grandmother     passed from unknown cancer  . Hypertension Maternal Grandmother   . Hypertension Maternal Grandfather     Social History  Substance Use Topics  . Smoking status: Never Smoker   . Smokeless tobacco: Never Used  . Alcohol Use: Yes     Review of Systems  10 Systems reviewed and all are negative for acute change except as noted in the HPI. Home Medications   Prior to Admission medications   Medication Sig Start Date End Date Taking? Authorizing Provider  benzonatate (TESSALON) 100 MG capsule Take 1 capsule (100  mg total) by mouth every 8 (eight) hours. 01/01/14   Loren Raceravid Yelverton, MD  fluticasone (FLONASE) 50 MCG/ACT nasal spray Place 2 sprays into both nostrils daily. 01/01/14   Loren Raceravid Yelverton, MD  oxyCODONE-acetaminophen (PERCOCET) 5-325 MG per tablet Take 1-2 tablets by mouth every 6 (six) hours as needed. 10/25/14   Geoffery Lyonsouglas Arabell Neria, MD  tamsulosin (FLOMAX) 0.4 MG CAPS capsule Take 1 capsule (0.4 mg total) by mouth daily. 10/25/14   Geoffery Lyonsouglas Evea Sheek, MD    Allergies  Review of patient's allergies indicates no known allergies.  Triage Vitals: BP 178/115 mmHg  Pulse 80  Temp(Src) 98.7 F (37.1 C) (Oral)  Resp 18  Ht 5\' 3"  (1.6 m)  Wt 180 lb (81.647 kg)  BMI 31.89 kg/m2  SpO2 100%  Physical Exam  Constitutional: He is oriented to person, place, and time. He appears well-developed and well-nourished.  HENT:  Head: Normocephalic and atraumatic.  Eyes: EOM are normal. Pupils are equal, round, and reactive to light.  No papilledema.   Neck: Normal range of motion.  Cardiovascular: Normal rate, regular rhythm, normal heart sounds and intact distal pulses.   Pulmonary/Chest: Effort normal and breath sounds normal. No respiratory distress.  Abdominal: Soft. He exhibits no distension. There is no tenderness.  Musculoskeletal: Normal range of motion.  Neurological: He is alert and oriented to person, place, and time. No cranial nerve deficit. He exhibits normal muscle tone. Coordination normal.  Skin: Skin is warm and dry.  Psychiatric: He has a normal  mood and affect. Judgment normal.  Nursing note and vitals reviewed.   ED Course  Procedures   DIAGNOSTIC STUDIES: Oxygen Saturation is 100% on RA, normal by my interpretation.    COORDINATION OF CARE: 11:28 PM Discussed treatment plan which includes CT head, lab work, and clonidine with pt at bedside and pt agreed to plan.  Labs Reviewed  BASIC METABOLIC PANEL  CBC WITH DIFFERENTIAL/PLATELET    Imaging Review No results found.  I personally  reviewed and evaluated these images and lab results as a part of my medical decision-making.   MDM   Final diagnoses:  None    Patient presents with complaints of elevated blood pressure, headache, and left arm numbness. His neurologic exam is nonfocal and head CT is negative. His laboratory studies are unremarkable. He was given a dose of clonidine and his blood pressure has improved. He is also feeling better. He has been seen by his primary Dr. for his blood pressure, however no medications have been started to this point. I will prescribe Norvasc for him. He is to start this if his blood pressures remain high. He is to also keep a record of his blood pressures which he can take it his doctors follow-up appointment.  I personally performed the services described in this documentation, which was scribed in my presence. The recorded information has been reviewed and is accurate.       Geoffery Lyons, MD 02/23/15 [phone]

## 2015-02-22 NOTE — ED Notes (Signed)
Patient drove himself to the ER today.

## 2015-02-22 NOTE — ED Notes (Signed)
Patient states that he has a HA starting this am, it has become progressively worse throughout the day and now his left arm is going numb.

## 2015-02-23 LAB — BASIC METABOLIC PANEL
Anion gap: 6 (ref 5–15)
BUN: 16 mg/dL (ref 6–20)
CHLORIDE: 105 mmol/L (ref 101–111)
CO2: 27 mmol/L (ref 22–32)
Calcium: 8.8 mg/dL — ABNORMAL LOW (ref 8.9–10.3)
Creatinine, Ser: 1.45 mg/dL — ABNORMAL HIGH (ref 0.61–1.24)
GFR calc Af Amer: >60 mL/min (ref >60)
GFR calc non Af Amer: >60 mL/min (ref >60)
Glucose, Bld: 110 mg/dL — ABNORMAL HIGH (ref 65–99)
POTASSIUM: 3.1 mmol/L — AB (ref 3.5–5.1)
Sodium: 138 mmol/L (ref 135–145)

## 2015-02-23 MED ORDER — AMLODIPINE BESYLATE 10 MG PO TABS
10.0000 mg | ORAL_TABLET | Freq: Every day | ORAL | Status: DC
Start: 2015-02-23 — End: 2019-07-31

## 2015-02-23 NOTE — Discharge Instructions (Signed)
Keep a record of your blood pressure regularly for the next several days. If your blood pressures remain elevated, begin the prescription for Norvasc you have been provided here today.  Keep a record of your blood pressures to take to your next doctor's appointment, ideally in the next 1 week.   General Headache Without Cause A headache is pain or discomfort felt around the head or neck area. The specific cause of a headache may not be found. There are many causes and types of headaches. A few common ones are:  Tension headaches.  Migraine headaches.  Cluster headaches.  Chronic daily headaches. HOME CARE INSTRUCTIONS  Watch your condition for any changes. Take these steps to help with your condition: Managing Pain  Take over-the-counter and prescription medicines only as told by your health care provider.  Lie down in a dark, quiet room when you have a headache.  If directed, apply ice to the head and neck area:  Put ice in a plastic bag.  Place a towel between your skin and the bag.  Leave the ice on for 20 minutes, 2-3 times per day.  Use a heating pad or hot shower to apply heat to the head and neck area as told by your health care provider.  Keep lights dim if bright lights bother you or make your headaches worse. Eating and Drinking  Eat meals on a regular schedule.  Limit alcohol use.  Decrease the amount of caffeine you drink, or stop drinking caffeine. General Instructions  Keep all follow-up visits as told by your health care provider. This is important.  Keep a headache journal to help find out what may trigger your headaches. For example, write down:  What you eat and drink.  How much sleep you get.  Any change to your diet or medicines.  Try massage or other relaxation techniques.  Limit stress.  Sit up straight, and do not tense your muscles.  Do not use tobacco products, including cigarettes, chewing tobacco, or e-cigarettes. If you need help  quitting, ask your health care provider.  Exercise regularly as told by your health care provider.  Sleep on a regular schedule. Get 7-9 hours of sleep, or the amount recommended by your health care provider. SEEK MEDICAL CARE IF:   Your symptoms are not helped by medicine.  You have a headache that is different from the usual headache.  You have nausea or you vomit.  You have a fever. SEEK IMMEDIATE MEDICAL CARE IF:   Your headache becomes severe.  You have repeated vomiting.  You have a stiff neck.  You have a loss of vision.  You have problems with speech.  You have pain in the eye or ear.  You have muscular weakness or loss of muscle control.  You lose your balance or have trouble walking.  You feel faint or pass out.  You have confusion.   This information is not intended to replace advice given to you by your health care provider. Make sure you discuss any questions you have with your health care provider.   Document Released: 03/22/2005 Document Revised: 12/11/2014 Document Reviewed: 07/15/2014 Elsevier Interactive Patient Education 2016 [employer].  Hypertension Hypertension, commonly called high blood pressure, is when the force of blood pumping through your arteries is too strong. Your arteries are the blood vessels that carry blood from your heart throughout your body. A blood pressure reading consists of a higher number over a lower number, such as 110/72. The higher number (  systolic) is the pressure inside your arteries when your heart pumps. The lower number (diastolic) is the pressure inside your arteries when your heart relaxes. Ideally you want your blood pressure below 120/80. Hypertension forces your heart to work harder to pump blood. Your arteries may become narrow or stiff. Having untreated or uncontrolled hypertension can cause heart attack, stroke, kidney disease, and other problems. RISK FACTORS Some risk factors for high blood pressure are  controllable. Others are not.  Risk factors you cannot control include:   Race. You may be at higher risk if you are African American.  Age. Risk increases with age.  Gender. Men are at higher risk than women before age 70 years. After age 22, women are at higher risk than men. Risk factors you can control include:  Not getting enough exercise or physical activity.  Being overweight.  Getting too much fat, sugar, calories, or salt in your diet.  Drinking too much alcohol. SIGNS AND SYMPTOMS Hypertension does not usually cause signs or symptoms. Extremely high blood pressure (hypertensive crisis) may cause headache, anxiety, shortness of breath, and nosebleed. DIAGNOSIS To check if you have hypertension, your health care provider will measure your blood pressure while you are seated, with your arm held at the level of your heart. It should be measured at least twice using the same arm. Certain conditions can cause a difference in blood pressure between your right and left arms. A blood pressure reading that is higher than normal on one occasion does not mean that you need treatment. If it is not clear whether you have high blood pressure, you may be asked to return on a different day to have your blood pressure checked again. Or, you may be asked to monitor your blood pressure at home for 1 or more weeks. TREATMENT Treating high blood pressure includes making lifestyle changes and possibly taking medicine. Living a healthy lifestyle can help lower high blood pressure. You may need to change some of your habits. Lifestyle changes may include:  Following the DASH diet. This diet is high in fruits, vegetables, and whole grains. It is low in salt, red meat, and added sugars.  Keep your sodium intake below 2,300 mg per day.  Getting at least 30-45 minutes of aerobic exercise at least 4 times per week.  Losing weight if necessary.  Not smoking.  Limiting alcoholic beverages.  Learning  ways to reduce stress. Your health care provider may prescribe medicine if lifestyle changes are not enough to get your blood pressure under control, and if one of the following is true:  You are 11-35 years of age and your systolic blood pressure is above 140.  You are 50 years of age or older, and your systolic blood pressure is above 150.  Your diastolic blood pressure is above 90.  You have diabetes, and your systolic blood pressure is over 140 or your diastolic blood pressure is over 90.  You have kidney disease and your blood pressure is above 140/90.  You have heart disease and your blood pressure is above 140/90. Your personal target blood pressure may vary depending on your medical conditions, your age, and other factors. HOME CARE INSTRUCTIONS  Have your blood pressure rechecked as directed by your health care provider.   Take medicines only as directed by your health care provider. Follow the directions carefully. Blood pressure medicines must be taken as prescribed. The medicine does not work as well when you skip doses. Skipping doses also puts you  at risk for problems.  Do not smoke.   Monitor your blood pressure at home as directed by your health care provider. SEEK MEDICAL CARE IF:   You think you are having a reaction to medicines taken.  You have recurrent headaches or feel dizzy.  You have swelling in your ankles.  You have trouble with your vision. SEEK IMMEDIATE MEDICAL CARE IF:  You develop a severe headache or confusion.  You have unusual weakness, numbness, or feel faint.  You have severe chest or abdominal pain.  You vomit repeatedly.  You have trouble breathing. MAKE SURE YOU:   Understand these instructions.  Will watch your condition.  Will get help right away if you are not doing well or get worse.   This information is not intended to replace advice given to you by your health care provider. Make sure you discuss any questions you  have with your health care provider.   Document Released: 03/22/2005 Document Revised: 08/06/2014 Document Reviewed: 01/12/2013 Elsevier Interactive Patient Education [employer]2016 [employer] Inc.

## 2018-03-06 ENCOUNTER — Other Ambulatory Visit: Payer: Self-pay

## 2018-03-06 ENCOUNTER — Emergency Department (HOSPITAL_BASED_OUTPATIENT_CLINIC_OR_DEPARTMENT_OTHER): Payer: Self-pay

## 2018-03-06 ENCOUNTER — Encounter (HOSPITAL_BASED_OUTPATIENT_CLINIC_OR_DEPARTMENT_OTHER): Payer: Self-pay | Admitting: Emergency Medicine

## 2018-03-06 ENCOUNTER — Emergency Department (HOSPITAL_BASED_OUTPATIENT_CLINIC_OR_DEPARTMENT_OTHER)
Admission: EM | Admit: 2018-03-06 | Discharge: 2018-03-06 | Disposition: A | Discharge status: Home / Self Care | Payer: Self-pay | Attending: Emergency Medicine | Admitting: Emergency Medicine

## 2018-03-06 DIAGNOSIS — I10 Essential (primary) hypertension: Secondary | ICD-10-CM | POA: Insufficient documentation

## 2018-03-06 DIAGNOSIS — R519 Headache, unspecified: Secondary | ICD-10-CM

## 2018-03-06 DIAGNOSIS — R51 Headache: Secondary | ICD-10-CM | POA: Insufficient documentation

## 2018-03-06 DIAGNOSIS — Z79899 Other long term (current) drug therapy: Secondary | ICD-10-CM | POA: Insufficient documentation

## 2018-03-06 LAB — CBC WITH DIFFERENTIAL/PLATELET
ABS IMMATURE GRANULOCYTES: 0.02 10*3/uL (ref 0.00–0.07)
BASOS ABS: 0.1 10*3/uL (ref 0.0–0.1)
BASOS PCT: 1 %
EOS ABS: 0.1 10*3/uL (ref 0.0–0.5)
Eosinophils Relative: 1 %
HCT: 43.2 % (ref 39.0–52.0)
Hemoglobin: 14.2 g/dL (ref 13.0–17.0)
IMMATURE GRANULOCYTES: 0 %
Lymphocytes Relative: 15 %
Lymphs Abs: 1.1 10*3/uL (ref 0.7–4.0)
MCH: 28.3 pg (ref 26.0–34.0)
MCHC: 32.9 g/dL (ref 30.0–36.0)
MCV: 86.1 fL (ref 80.0–100.0)
Monocytes Absolute: 0.4 10*3/uL (ref 0.1–1.0)
Monocytes Relative: 5 %
NEUTROS ABS: 5.7 10*3/uL (ref 1.7–7.7)
NEUTROS PCT: 78 %
NRBC: 0 % (ref 0.0–0.2)
Platelets: 281 10*3/uL (ref 150–400)
RBC: 5.02 MIL/uL (ref 4.22–5.81)
RDW: 13.3 % (ref 11.5–15.5)
WBC: 7.4 10*3/uL (ref 4.0–10.5)

## 2018-03-06 LAB — BASIC METABOLIC PANEL
ANION GAP: 7 (ref 5–15)
BUN: 12 mg/dL (ref 6–20)
CALCIUM: 8.6 mg/dL — AB (ref 8.9–10.3)
CO2: 26 mmol/L (ref 22–32)
Chloride: 104 mmol/L (ref 98–111)
Creatinine, Ser: 1.25 mg/dL — ABNORMAL HIGH (ref 0.61–1.24)
GFR calc Af Amer: >60 mL/min (ref >60)
GLUCOSE: 118 mg/dL — AB (ref 70–99)
POTASSIUM: 3 mmol/L — AB (ref 3.5–5.1)
SODIUM: 137 mmol/L (ref 135–145)

## 2018-03-06 LAB — TROPONIN I

## 2018-03-06 MED ORDER — PROCHLORPERAZINE MALEATE 10 MG PO TABS
10.0000 mg | ORAL_TABLET | Freq: Once | ORAL | Status: AC
  Administered 2018-03-06: 10 mg via ORAL
  Filled 2018-03-06: qty 1

## 2018-03-06 MED ORDER — DIPHENHYDRAMINE HCL 25 MG PO CAPS
25.0000 mg | ORAL_CAPSULE | Freq: Once | ORAL | Status: AC
  Administered 2018-03-06: 25 mg via ORAL
  Filled 2018-03-06: qty 1

## 2018-03-06 MED ORDER — ACETAMINOPHEN 325 MG PO TABS
650.0000 mg | ORAL_TABLET | Freq: Once | ORAL | Status: AC
  Administered 2018-03-06: 650 mg via ORAL
  Filled 2018-03-06: qty 2

## 2018-03-06 NOTE — ED Triage Notes (Signed)
Pt has headache since yesterday.  Pt states the pain is worse.  Now pt c/o nausea.  Pt has been off his bp medications for a few days but has re-started his meds this am.

## 2018-03-06 NOTE — ED Provider Notes (Signed)
[facility] EMERGENCY DEPARTMENT Provider Note   CSN: [id number] Arrival date & time: 03/06/18  0844     History   Chief Complaint Chief Complaint  Patient presents with  . Headache    HPI Chris Harrington is a 42 y.o. male.  The history is provided by the patient.  Headache   This is a new problem. The current episode started 2 days ago. The problem occurs hourly. Progression since onset: waxing and waning. The headache is associated with nothing. The pain is located in the frontal region. The quality of the pain is described as dull. The pain is at a severity of 3/10. The pain is mild. The pain does not radiate. Associated symptoms include malaise/fatigue, chest pressure and nausea. Pertinent negatives include no anorexia, no fever, no near-syncope, no orthopnea, no palpitations, no syncope, no shortness of breath and no vomiting. He has tried nothing for the symptoms. The treatment provided no relief.    Past Medical History:  Diagnosis Date  . Hypertension     Patient Active Problem List   Diagnosis Date Noted  . Elevated BP 02/15/2013  . Cough 02/15/2013    Past Surgical History:  Procedure Laterality Date  . TONSILECTOMY, ADENOIDECTOMY, BILATERAL MYRINGOTOMY AND TUBES     pt has not had tubes in ears        Home Medications    Prior to Admission medications   Medication Sig Start Date End Date Taking? Authorizing Provider  amLODipine (NORVASC) 10 MG tablet Take 1 tablet (10 mg total) by mouth daily. 02/23/15   Geoffery Lyonselo, Douglas, MD    Family History Family History  Problem Relation Age of Onset  . Hypertension Mother   . Hypertension Father   . Cancer Paternal Grandmother        passed from unknown cancer  . Hypertension Maternal Grandmother   . Hypertension Maternal Grandfather     Social History Social History   Tobacco Use  . Smoking status: Never Smoker  . Smokeless tobacco: Never Used  Substance Use Topics  . Alcohol use: Yes  . Drug  use: No     Allergies   Patient has no known allergies.   Review of Systems Review of Systems  Constitutional: Positive for malaise/fatigue. Negative for chills and fever.  HENT: Negative for ear pain and sore throat.   Eyes: Negative for pain and visual disturbance.  Respiratory: Negative for cough and shortness of breath.   Cardiovascular: Positive for chest pain. Negative for palpitations, orthopnea, syncope and near-syncope.  Gastrointestinal: Positive for nausea. Negative for abdominal pain, anorexia and vomiting.  Genitourinary: Negative for dysuria and hematuria.  Musculoskeletal: Positive for myalgias. Negative for arthralgias and back pain.  Skin: Negative for color change and rash.  Neurological: Positive for headaches. Negative for seizures and syncope.  All other systems reviewed and are negative.    Physical Exam Updated Vital Signs  ED Triage Vitals  Enc Vitals Group     BP 03/06/18 0853 (!) 143/99     Pulse Rate 03/06/18 0853 84     Resp 03/06/18 0853 16     Temp 03/06/18 0853 98.3 F (36.8 C)     Temp Source 03/06/18 0853 Oral     SpO2 03/06/18 0853 99 %     Weight 03/06/18 0900 190 lb (86.2 kg)     Height 03/06/18 0900 5\' 3"  (1.6 m)     Head Circumference --      Peak Flow --  Pain Score 03/06/18 0859 10     Pain Loc --      Pain Edu? --      Excl. in GC? --     Physical Exam  Constitutional: He is oriented to person, place, and time. He appears well-developed and well-nourished.  HENT:  Head: Normocephalic and atraumatic.  Eyes: Pupils are equal, round, and reactive to light. Conjunctivae and EOM are normal. Right eye exhibits normal extraocular motion and no nystagmus. Left eye exhibits normal extraocular motion and no nystagmus. Right pupil is round and reactive. Left pupil is round and reactive.  Neck: Normal range of motion. Neck supple.  Cardiovascular: Normal rate and regular rhythm.  No murmur heard. Pulmonary/Chest: Effort normal and  breath sounds normal. No respiratory distress.  Abdominal: Soft. There is no tenderness.  Musculoskeletal: He exhibits no edema.  Neurological: He is alert and oriented to person, place, and time. He has normal strength. He is not disoriented. No cranial nerve deficit or sensory deficit. Coordination normal.  5+/5 strength and normal sensation throughout, no drift, normal finger to nose finger  Skin: Skin is warm and dry. Capillary refill takes less than 2 seconds.  Psychiatric: He has a normal mood and affect.  Nursing note and vitals reviewed.    ED Treatments / Results  Labs (all labs ordered are listed, but only abnormal results are displayed) Labs Reviewed  BASIC METABOLIC PANEL - Abnormal; Notable for the following components:      Result Value   Potassium 3.0 (*)    Glucose, Bld 118 (*)    Creatinine, Ser 1.25 (*)    Calcium 8.6 (*)    All other components within normal limits  CBC WITH DIFFERENTIAL/PLATELET  TROPONIN I    EKG  Normal sinus rhythm.  No signs of ischemic changes.  Normal intervals.  Rate of 73  Radiology Dg Chest 2 View  Result Date: 03/06/2018 CLINICAL DATA:  Chest pain EXAM: CHEST - 2 VIEW COMPARISON:  05/15/2009 FINDINGS: The heart size and mediastinal contours are within normal limits. Both lungs are clear. The visualized skeletal structures are unremarkable. IMPRESSION: No active cardiopulmonary disease. Electronically Signed   By: Alcide Clever M.D.   On: 03/06/2018 09:19    Procedures Procedures (including critical care time)  Medications Ordered in ED Medications  prochlorperazine (COMPAZINE) tablet 10 mg (has no administration in time range)  diphenhydrAMINE (BENADRYL) capsule 25 mg (has no administration in time range)  acetaminophen (TYLENOL) tablet 650 mg (650 mg Oral Given 03/06/18 0939)     Initial Impression / Assessment and Plan / ED Course  I have reviewed the triage vital signs and the nursing notes.  Pertinent labs & imaging  results that were available during my care of the patient were reviewed by me and considered in my medical decision making (see chart for details).     Chris Harrington is a 42 year old male with history of hypertension who presents to the ED with multiple complaints.  Patient with normal vitals.  No fever.  Patient with mild headache, body aches.  States some intermittent chest pain, nausea.  Patient overall well-appearing.  Normal neurological exam.  Patient appears to likely have viral process.  EKG shows sinus rhythm.  No signs of ischemic changes.  Troponin within normal limits.  Doubt cardiac process.  Chest x-ray showed no signs of pneumonia, pneumothorax, pleural effusion.  Patient is PERC negative and doubt PE.  Patient with normal neurological exam.  Improvement following headache cocktail.  Suspect headache from viral process or migraine. No need for head CT at this time.  Patient with no significant anemia, electrolyte abnormality, kidney injury.  Recommend follow-up with primary care doctor and discharged from ED in good condition.  This chart was dictated using voice recognition software.  Despite best efforts to proofread,  errors can occur which can change the documentation meaning.   Final Clinical Impressions(s) / ED Diagnoses   Final diagnoses:  Nonintractable headache, unspecified chronicity pattern, unspecified headache type    ED Discharge Orders    None       Virgina Norfolk, DO 03/06/18 1056

## 2018-03-06 NOTE — Discharge Instructions (Addendum)
Continue tylenol and motrin at home.

## 2019-03-24 ENCOUNTER — Encounter (HOSPITAL_BASED_OUTPATIENT_CLINIC_OR_DEPARTMENT_OTHER): Payer: Self-pay | Admitting: Emergency Medicine

## 2019-03-24 ENCOUNTER — Other Ambulatory Visit: Payer: Self-pay

## 2019-03-24 ENCOUNTER — Emergency Department (HOSPITAL_BASED_OUTPATIENT_CLINIC_OR_DEPARTMENT_OTHER)
Admission: EM | Admit: 2019-03-24 | Discharge: 2019-03-24 | Disposition: A | Discharge status: Home / Self Care | Payer: Self-pay | Attending: Emergency Medicine | Admitting: Emergency Medicine

## 2019-03-24 DIAGNOSIS — E876 Hypokalemia: Secondary | ICD-10-CM | POA: Insufficient documentation

## 2019-03-24 DIAGNOSIS — U071 COVID-19: Secondary | ICD-10-CM | POA: Insufficient documentation

## 2019-03-24 DIAGNOSIS — I10 Essential (primary) hypertension: Secondary | ICD-10-CM | POA: Insufficient documentation

## 2019-03-24 DIAGNOSIS — Z79899 Other long term (current) drug therapy: Secondary | ICD-10-CM | POA: Insufficient documentation

## 2019-03-24 DIAGNOSIS — B349 Viral infection, unspecified: Secondary | ICD-10-CM | POA: Insufficient documentation

## 2019-03-24 LAB — BASIC METABOLIC PANEL
Anion gap: 10 (ref 5–15)
BUN: 11 mg/dL (ref 6–20)
CO2: 27 mmol/L (ref 22–32)
Calcium: 8.2 mg/dL — ABNORMAL LOW (ref 8.9–10.3)
Chloride: 100 mmol/L (ref 98–111)
Creatinine, Ser: 1.24 mg/dL (ref 0.61–1.24)
GFR calc Af Amer: >60 mL/min (ref >60)
GFR calc non Af Amer: >60 mL/min (ref >60)
Glucose, Bld: 160 mg/dL — ABNORMAL HIGH (ref 70–99)
Potassium: 2.8 mmol/L — ABNORMAL LOW (ref 3.5–5.1)
Sodium: 137 mmol/L (ref 135–145)

## 2019-03-24 LAB — MAGNESIUM: Magnesium: 2.2 mg/dL (ref 1.7–2.4)

## 2019-03-24 MED ORDER — POTASSIUM CHLORIDE CRYS ER 20 MEQ PO TBCR: 20.0000 meq | EXTENDED_RELEASE_TABLET | Freq: Every day | ORAL | 0 refills | Status: DC

## 2019-03-24 MED ORDER — POTASSIUM CHLORIDE CRYS ER 20 MEQ PO TBCR
40.0000 meq | EXTENDED_RELEASE_TABLET | Freq: Once | ORAL | Status: AC
  Administered 2019-03-24: 17:00:00 40 meq via ORAL
  Filled 2019-03-24: qty 2

## 2019-03-24 MED ORDER — POTASSIUM CHLORIDE 10 MEQ/100ML IV SOLN
10.0000 meq | Freq: Once | INTRAVENOUS | Status: AC
  Administered 2019-03-24: 10 meq via INTRAVENOUS
  Filled 2019-03-24: qty 100

## 2019-03-24 NOTE — ED Triage Notes (Addendum)
Pt c/o cough, fever, sob, generalized body aches x 1 week. Pt reports palpitations x 2 days. Pt also c/o nausea  X 1 week, denies emesis, denies diarrhea at this time. Pt reports exposure to Covid on sunday

## 2019-03-24 NOTE — Discharge Instructions (Signed)
Your potassium was low today.  We gave you potassium in the ER.  This may be explaining why you are having these irregular skipped heartbeats.  I prescribed potassium pills for approximately 3 weeks.  I advised you to take these pills for a minimum of 1 week.  If you are able to, you should try to eat some potassium containing foods every day.  You are able to eat these foods every day, you do not need to continue taking the pills for the full 3 weeks.  It is very important that you follow-up with a primary care doctor and have your potassium level rechecked sometime in the next month.  Your Covid test is pending.  You should have results in about 2 days.  In the meantime, you should quarantine at home and assume that you are Covid positive until you know your results.

## 2019-03-24 NOTE — ED Provider Notes (Signed)
[facility] EMERGENCY DEPARTMENT Provider Note   CSN: [id number] Arrival date & time: 03/24/19  1258     History Chief Complaint  Patient presents with  . Palpitations    COVID symptoms    Chris Harrington is a 43 y.o. male with a history of hypertension presenting to the ED with a viral-like symptoms.  The patient reports 1 week of myalgias, headache, fatigue, nausea, diminished appetite, loss of taste and smell.  He also reports some mild shortness of breath and dyspnea.  He says he is felt very weak.  He feels like he had some fevers and chills but did not check a temperature at home.  He also reports that for the past 2 days been having palpitations.  These keep him up at night.  He said this is the reason he said to come to the emergency department.  Patient reports that he was at his grandmother's house earlier this week, and she recently was tested positive for COVID-19.  He does not smoke cigarettes.  He is a light social drinker.  Does not use any illicit drugs.  He does report that he drinks soda nearly daily.  He also drinks Gatorade.  No coffee or energy drinks.  He has no personal history of MI or cardiac disease.  Family history notable only for congestive heart failure in his mother.  No hemoptysis or asymmetric LE edema. Patient denies personal or family history of DVT or PE. No recent hormone use (including OCP); travel for >6 hours; prolonged immobilization for greater than 3 days; surgeries or trauma in the last 4 weeks; or malignancy with treatment within 6 months.   HPI     Past Medical History:  Diagnosis Date  . Hypertension     Patient Active Problem List   Diagnosis Date Noted  . Elevated BP 02/15/2013  . Cough 02/15/2013    Past Surgical History:  Procedure Laterality Date  . TONSILECTOMY, ADENOIDECTOMY, BILATERAL MYRINGOTOMY AND TUBES     pt has not had tubes in ears       Family History  Problem Relation Age of Onset  .  Hypertension Mother   . Hypertension Father   . Cancer Paternal Grandmother        passed from unknown cancer  . Hypertension Maternal Grandmother   . Hypertension Maternal Grandfather     Social History   Tobacco Use  . Smoking status: Never Smoker  . Smokeless tobacco: Never Used  Substance Use Topics  . Alcohol use: Yes  . Drug use: No    Home Medications Prior to Admission medications   Medication Sig Start Date End Date Taking? Authorizing Provider  amLODipine (NORVASC) 10 MG tablet Take 1 tablet (10 mg total) by mouth daily. 02/23/15   Geoffery Lyons, MD  potassium chloride SA (KLOR-CON) 20 MEQ tablet Take 1 tablet (20 mEq total) by mouth daily for 21 days. 03/25/19 04/15/19  Terald Sleeper, MD    Allergies    Patient has no known allergies.  Review of Systems   Review of Systems  Constitutional: Positive for appetite change, chills, fatigue and fever.  HENT: Positive for congestion and sneezing.   Eyes: Negative for pain and visual disturbance.  Respiratory: Positive for shortness of breath. Negative for cough.   Cardiovascular: Positive for palpitations. Negative for chest pain.  Gastrointestinal: Negative for abdominal pain and vomiting.  Musculoskeletal: Positive for arthralgias and myalgias.  Skin: Negative for color change and rash.  Neurological: Positive  for headaches. Negative for syncope.  Psychiatric/Behavioral: Negative for agitation and confusion.  All other systems reviewed and are negative.   Physical Exam Updated Vital Signs BP 134/87   Pulse 81   Temp 98.8 F (37.1 C) (Oral)   Resp 20   Ht 5\' 3"  (1.6 m)   Wt 90.7 kg   SpO2 97%   BMI 35.43 kg/m   Physical Exam Vitals and nursing note reviewed.  Constitutional:      Appearance: He is well-developed.  HENT:     Head: Normocephalic and atraumatic.  Eyes:     Conjunctiva/sclera: Conjunctivae normal.  Cardiovascular:     Rate and Rhythm: Normal rate and regular rhythm.     Pulses:  Normal pulses.     Heart sounds: No murmur.  Pulmonary:     Effort: Pulmonary effort is normal. No respiratory distress.     Breath sounds: Normal breath sounds.  Abdominal:     Palpations: Abdomen is soft.     Tenderness: There is no abdominal tenderness.  Musculoskeletal:     Cervical back: Neck supple.  Skin:    General: Skin is warm and dry.  Neurological:     General: No focal deficit present.     Mental Status: He is alert and oriented to person, place, and time.  Psychiatric:        Mood and Affect: Mood normal.        Behavior: Behavior normal.     ED Results / Procedures / Treatments   Labs (all labs ordered are listed, but only abnormal results are displayed) Labs Reviewed  BASIC METABOLIC PANEL - Abnormal; Notable for the following components:      Result Value   Potassium 2.8 (*)    Glucose, Bld 160 (*)    Calcium 8.2 (*)    All other components within normal limits  NOVEL CORONAVIRUS, NAA (HOSP ORDER, SEND-OUT TO REF LAB; TAT 18-24 HRS)  MAGNESIUM    EKG None  Radiology No results found.  Procedures .Critical Care Performed by: Wyvonnia Dusky, MD Authorized by: Wyvonnia Dusky, MD   Critical care provider statement:    Critical care time (minutes):  30   Critical care was necessary to treat or prevent imminent or life-threatening deterioration of the following conditions:  Metabolic crisis   Critical care was time spent personally by me on the following activities:  Discussions with consultants, evaluation of patient's response to treatment, examination of patient, ordering and performing treatments and interventions, ordering and review of laboratory studies, ordering and review of radiographic studies, pulse oximetry, re-evaluation of patient's condition, obtaining history from patient or surrogate and review of old charts Comments:     IV potassium infusion for hypokalemia with PVC's on ECG   (including critical care time)  Medications  Ordered in ED Medications  potassium chloride 10 mEq in 100 mL IVPB (0 mEq Intravenous Stopped 03/24/19 1800)  potassium chloride SA (KLOR-CON) CR tablet 40 mEq (40 mEq Oral Given 03/24/19 1653)    ED Course  I have reviewed the triage vital signs and the nursing notes.  Pertinent labs & imaging results that were available during my care of the patient were reviewed by me and considered in my medical decision making (see chart for details).    MDM Rules/Calculators/A&P                       This is a well-appearing 43 year old male with a history  of hypertension presented to ED with viral-like syndrome which most consistent with COVID-19.  He describes palpitations for 2 days and appears to have some PVCs on his EKG.  In the setting of poor appetite, believe it is reasonable to check his electrolytes and ensure does not have potassium magnesium deficiency causing this.  Otherwise, explained to him that he should try to cut caffeine out of his daily intake, it is possible his daily soda consumption may be contributing to his cardiac irritability.  I have very low suspicion for acute coronary syndrome or pulmonary embolism based on his presentation.  I likewise have a low suspicion for bacterial pneumonia given his benign pulmonary exam, no fever on presentation.  Advised him we will send out the 48-hour PCR swab, he should self quarantine until he knows his results, this is very likely that he has COVID-19.  He was in direct close contact with a known Covid positive person in the past week (grandmother).  Gelene Minkravis Sarsfield was evaluated in Emergency Department on 03/24/2019 for the symptoms described in the history of present illness. He was evaluated in the context of the global COVID-19 pandemic, which necessitated consideration that the patient might be at risk for infection with the SARS-CoV-2 virus that causes COVID-19. Institutional protocols and algorithms that pertain to the evaluation of  patients at risk for COVID-19 are in a state of rapid change based on information released by regulatory bodies including the CDC and federal and state organizations. These policies and algorithms were followed during the patient's care in the ED.     Final Clinical Impression(s) / ED Diagnoses Final diagnoses:  Hypokalemia  Viral illness    Rx / DC Orders ED Discharge Orders         Ordered    potassium chloride SA (KLOR-CON) 20 MEQ tablet  Daily     03/24/19 1749           Terald Sleeperrifan, Madina Galati J, MD 03/24/19 2311

## 2019-03-27 LAB — NOVEL CORONAVIRUS, NAA (HOSP ORDER, SEND-OUT TO REF LAB; TAT 18-24 HRS): SARS-CoV-2, NAA: DETECTED — AB

## 2019-07-02 ENCOUNTER — Other Ambulatory Visit: Payer: Self-pay

## 2019-07-02 ENCOUNTER — Ambulatory Visit (INDEPENDENT_AMBULATORY_CARE_PROVIDER_SITE_OTHER): Payer: 59 | Admitting: Family Medicine

## 2019-07-02 ENCOUNTER — Encounter (INDEPENDENT_AMBULATORY_CARE_PROVIDER_SITE_OTHER): Payer: Self-pay | Admitting: Family Medicine

## 2019-07-02 VITALS — BP 143/82 | HR 72 | Temp 97.8°F | Ht 62.0 in | Wt 209.0 lb

## 2019-07-02 DIAGNOSIS — R5383 Other fatigue: Secondary | ICD-10-CM

## 2019-07-02 DIAGNOSIS — Z6838 Body mass index (BMI) 38.0-38.9, adult: Secondary | ICD-10-CM

## 2019-07-02 DIAGNOSIS — I10 Essential (primary) hypertension: Secondary | ICD-10-CM

## 2019-07-02 DIAGNOSIS — Z0289 Encounter for other administrative examinations: Secondary | ICD-10-CM

## 2019-07-02 DIAGNOSIS — Z1331 Encounter for screening for depression: Secondary | ICD-10-CM

## 2019-07-02 DIAGNOSIS — Z9189 Other specified personal risk factors, not elsewhere classified: Secondary | ICD-10-CM | POA: Diagnosis not present

## 2019-07-02 DIAGNOSIS — G4709 Other insomnia: Secondary | ICD-10-CM | POA: Diagnosis not present

## 2019-07-02 DIAGNOSIS — R0602 Shortness of breath: Secondary | ICD-10-CM | POA: Diagnosis not present

## 2019-07-02 NOTE — Progress Notes (Signed)
Chief Complaint:   OBESITY Chris Harrington (MR# [id number]) is a 44 y.o. male who presents for evaluation and treatment of obesity and related comorbidities. Current BMI is Body mass index is 38.23 kg/m.Marland Kitchen Chris Harrington has been struggling with his weight for many years and has been unsuccessful in either losing weight, maintaining weight loss, or reaching his healthy weight goal.  Chris Harrington is currently in the action stage of change and ready to dedicate time achieving and maintaining a healthier weight. Chris Harrington is interested in becoming our patient and working on intensive lifestyle modifications including (but not limited to) diet and exercise for weight loss.  Chris Harrington found our clinic through a [employer]. He is eating out 7 times per week - Chris Harrington, [employer], [address], Chris Harrington, and Chris Harrington. He skips breakfast daily; has a sandwich between 11 a.m. and 12 p.m. with 3-4 slices of ham with mayonnaise or a burger from the diner with ketchup, mustard, and onion with fries. He reports not being full from the sandwich, but being full from the diner. For snacks if he is working from home he has 3-4 cups honeycombs (hungry at snack). For 5 p.m. dinner he has Chris Harrington with steak, vegetables and rice and reports feeling full. He eats all the steak, most of the vegetables and does not eat all of the rice.  Chris Harrington's habits were reviewed today and are as follows: his desired weight loss is 34 lbs, he started gaining weight at the age of 50, his heaviest weight ever was 209 pounds, he is a picky eater and doesn'Chris like to eat healthier foods, he craves pasta, pizza, and subs, he snacks frequently in the evenings, he skips breakfast everyday, he is frequently drinking liquids with calories, he frequently makes poor food choices, he has problems with excessive hunger, he frequently eats larger portions than normal, he has binge eating behaviors and he struggles with emotional eating.  Depression  Screen Chris Harrington Food and Mood (modified PHQ-9) score was 18.  Depression screen Chris Harrington 2/9 07/02/2019  Decreased Interest 3  Down, Depressed, Hopeless 2  PHQ - 2 Score 5  Altered sleeping 3  Tired, decreased energy 3  Change in appetite 3  Feeling bad or failure about yourself  1  Trouble concentrating 3  Moving slowly or fidgety/restless 0  Suicidal thoughts 0  PHQ-9 Score 18  Difficult doing work/chores Not difficult at all   Subjective:   Other fatigue. [name] denies daytime somnolence and admits to waking up still tired. Patent has a history of symptoms of Epworth sleepiness scale. Chris Harrington generally gets 3 hours of sleep per night, and states that he does not sleep well most nights. Snoring is present. Apneic episodes are present. Epworth Sleepiness Score is 12. EKG showed NSR at 72 BPM - similar to EKG December 2020.  Shortness of breath on exertion. Chris Harrington notes increasing shortness of breath with exercising and seems to be worsening over time with weight gain. He notes getting out of breath sooner with activity than he used to. Chris Harrington denies shortness of breath at rest or orthopnea.  Essential hypertension. Blood pressure is elevated today. Chris Harrington states he took his medications. EKG showed NSR. He has only ever been on amlodipine. Hypertension was diagnosed 3-4 years ago. He states blood pressure has not been well controlled for a while.  BP Readings from Last 3 Encounters:  07/02/19 (!) 143/82  03/24/19 134/87  03/06/18 138/80   Lab Results  Component Value Date   CREATININE  1.24 03/24/2019   CREATININE 1.25 (H) 03/06/2018   CREATININE 1.45 (H) 02/22/2015   Other insomnia. Chris Harrington reports poor sleep quality and quantity. He is sleeping 3 hours nightly. He has not been worked up for sleep apnea.  Depression screening. Chris Harrington has a strongly positive depression screen with a PHQ-9 score of 18.  At risk for heart disease. Chris Harrington is at a higher than average risk for  cardiovascular disease due to obesity.   Assessment/Plan:   Other fatigue. Fatigue may be related to obesity, depression or many other causes. Labs will be ordered, and in the meanwhile, Chris Harrington will focus on self care including making healthy food choices, increasing physical activity and focusing on stress reduction. EKG 12-Lead, Vitamin B12, CBC with Differential/Platelet, Comprehensive metabolic panel, T3, T4, free, TSH, Folate, Hemoglobin A1c, Insulin, random, VITAMIN D 25 Hydroxy (Vit-D Deficiency, Fractures) studies ordered.  Shortness of breath on exertion.  Chris Harrington's shortness of breath appears to be obesity related and exercise induced. He has agreed to work on weight loss and gradually increase exercise to treat his exercise induced shortness of breath. Will continue to monitor closely. Lipid Panel With LDL/HDL Ratio labs ordered.  Essential hypertension. Chris Harrington is working on healthy weight loss and exercise to improve blood pressure control. We will watch for signs of hypotension as he continues his lifestyle modifications. CMP and EKG ordered.  Other insomnia. The problem of recurrent insomnia was discussed. Orders and follow up as documented in patient record. Counseling: Intensive lifestyle modifications are the first line treatment for this issue. We discussed several lifestyle modifications today and he will continue to work on diet, exercise and weight loss efforts. Ambulatory referral to [facility] for sleep study.  Counseling  Limit or avoid alcohol, caffeinated beverages, and cigarettes, especially close to bedtime.   Do not eat a large meal or eat spicy foods right before bedtime. This can lead to digestive discomfort that can make it hard for you to sleep.  Keep a sleep diary to help you and your health care provider figure out what could be causing your insomnia.  . Make your bedroom a dark, comfortable place where it is easy to fall asleep. ? Put up shades or  blackout curtains to block light from outside. ? Use a white noise machine to block noise. ? Keep the temperature cool. . Limit screen use before bedtime. This includes: ? Watching TV. ? Using your smartphone, tablet, or computer. . Stick to a routine that includes going to bed and waking up at the same times every day and night. This can help you fall asleep faster. Consider making a quiet activity, such as reading, part of your nighttime routine. . Try to avoid taking naps during the day so that you sleep better at night. . Get out of bed if you are still awake after 15 minutes of trying to sleep. Keep the lights down, but try reading or doing a quiet activity. When you feel sleepy, go back to bed.  Depression screening. Chris Harrington had a positive depression screening. Depression is commonly associated with obesity and often results in emotional eating behaviors. We will monitor this closely and work on CBT to help improve the non-hunger eating patterns. Referral to Psychology may be required if no improvement is seen as he continues in our clinic.  At risk for heart disease. Chris Harrington was given approximately 15 minutes of coronary artery disease prevention counseling today. He is 44 y.o. male and has risk factors for heart disease  including obesity. We discussed intensive lifestyle modifications today with an emphasis on specific weight loss instructions and strategies.   Repetitive spaced learning was employed today to elicit superior memory formation and behavioral change.  Class 2 severe obesity with serious comorbidity and body mass index (BMI) of 38.0 to 38.9 in adult, unspecified obesity type (HCC).  Chris Harrington is currently in the action stage of change and his goal is to continue with weight loss efforts. I recommend Chris Harrington begin the structured treatment plan as follows:  He has agreed to the Category 3 Plan and will journal 1450-1600 calories and 95+ grams of protein daily.  Exercise goals: No  exercise has been prescribed at this time.   Behavioral modification strategies: increasing lean protein intake, increasing vegetables, meal planning and cooking strategies, keeping healthy foods in the home and planning for success.  He was informed of the importance of frequent follow-up visits to maximize his success with intensive lifestyle modifications for his multiple health conditions. He was informed we would discuss his lab results at his next visit unless there is a critical issue that needs to be addressed sooner. Chris Harrington agreed to keep his next visit at the agreed upon time to discuss these results.  Objective:   Blood pressure (!) 143/82, pulse 72, temperature 97.8 F (36.6 C), temperature source Oral, height 5\' 2"  (1.575 m), weight 209 lb (94.8 kg), SpO2 98 %. Body mass index is 38.23 kg/m.  EKG: Normal sinus rhythm, rate 72. Within normal limits.  Indirect Calorimeter completed today shows a VO2 of 163 and a REE of 1133.  His calculated basal metabolic rate is thus his basal metabolic rate is worse than expected.  General: Cooperative, alert, well developed, in no acute distress. HEENT: Conjunctivae and lids unremarkable. Cardiovascular: Regular rhythm.  Lungs: Normal work of breathing. Neurologic: No focal deficits.   Lab Results  Component Value Date   CREATININE 1.24 03/24/2019   BUN 11 03/24/2019   NA 137 03/24/2019   K 2.8 (L) 03/24/2019   CL 100 03/24/2019   CO2 27 03/24/2019   Lab Results  Component Value Date   ALT 26 02/15/2013   AST 25 02/15/2013   ALKPHOS 64 02/15/2013   BILITOT 0.9 02/15/2013   No results found for: HGBA1C No results found for: INSULIN Lab Results  Component Value Date   TSH 1.87 01/09/2014   Lab Results  Component Value Date   CHOL 153 02/15/2013   HDL 32.30 (L) 02/15/2013   LDLCALC 110 (H) 02/15/2013   TRIG 52.0 02/15/2013   CHOLHDL 5 02/15/2013   Lab Results  Component Value Date   WBC 7.4 03/06/2018   HGB 14.2  03/06/2018   HCT 43.2 03/06/2018   MCV 86.1 03/06/2018   PLT 281 03/06/2018   No results found for: IRON, TIBC, FERRITIN  Attestation Statements:   Reviewed by clinician on day of visit: allergies, medications, problem list, medical history, surgical history, family history, social history, and previous encounter notes.  I, 14/05/2017, am acting as transcriptionist for Marianna Payment, MD   I have reviewed the above documentation for accuracy and completeness, and I agree with the above. - Reuben Likes, MD

## 2019-07-03 LAB — CBC WITH DIFFERENTIAL/PLATELET
Basophils Absolute: 0.1 10*3/uL (ref 0.0–0.2)
Basos: 1 %
EOS (ABSOLUTE): 0.3 10*3/uL (ref 0.0–0.4)
Eos: 4 %
Hematocrit: 43 % (ref 37.5–51.0)
Hemoglobin: 14.3 g/dL (ref 13.0–17.7)
Immature Grans (Abs): 0 10*3/uL (ref 0.0–0.1)
Immature Granulocytes: 0 %
Lymphocytes Absolute: 2 10*3/uL (ref 0.7–3.1)
Lymphs: 31 %
MCH: 29.1 pg (ref 26.6–33.0)
MCHC: 33.3 g/dL (ref 31.5–35.7)
MCV: 88 fL (ref 79–97)
Monocytes Absolute: 0.5 10*3/uL (ref 0.1–0.9)
Monocytes: 8 %
Neutrophils Absolute: 3.5 10*3/uL (ref 1.4–7.0)
Neutrophils: 56 %
Platelets: 302 10*3/uL (ref 150–450)
RBC: 4.91 x10E6/uL (ref 4.14–5.80)
RDW: 13.7 % (ref 11.6–15.4)
WBC: 6.4 10*3/uL (ref 3.4–10.8)

## 2019-07-03 LAB — COMPREHENSIVE METABOLIC PANEL WITH GFR
ALT: 19 [IU]/L (ref 0–44)
AST: 19 [IU]/L (ref 0–40)
Albumin/Globulin Ratio: 1.1 — ABNORMAL LOW (ref 1.2–2.2)
Albumin: 4 g/dL (ref 4.0–5.0)
Alkaline Phosphatase: 85 [IU]/L (ref 39–117)
BUN/Creatinine Ratio: 11 (ref 9–20)
BUN: 13 mg/dL (ref 6–24)
Bilirubin Total: 0.7 mg/dL (ref 0.0–1.2)
CO2: 25 mmol/L (ref 20–29)
Calcium: 9 mg/dL (ref 8.7–10.2)
Chloride: 100 mmol/L (ref 96–106)
Creatinine, Ser: 1.19 mg/dL (ref 0.76–1.27)
GFR calc Af Amer: 87 mL/min/{1.73_m2}
GFR calc non Af Amer: 75 mL/min/{1.73_m2}
Globulin, Total: 3.6 g/dL (ref 1.5–4.5)
Glucose: 101 mg/dL — ABNORMAL HIGH (ref 65–99)
Potassium: 3.3 mmol/L — ABNORMAL LOW (ref 3.5–5.2)
Sodium: 140 mmol/L (ref 134–144)
Total Protein: 7.6 g/dL (ref 6.0–8.5)

## 2019-07-03 LAB — LIPID PANEL WITH LDL/HDL RATIO
Cholesterol, Total: 173 mg/dL (ref 100–199)
HDL: 32 mg/dL — ABNORMAL LOW (ref >39)
LDL Chol Calc (NIH): 122 mg/dL — ABNORMAL HIGH (ref 0–99)
LDL/HDL Ratio: 3.8 ratio — ABNORMAL HIGH (ref 0.0–3.6)
Triglycerides: 101 mg/dL (ref 0–149)
VLDL Cholesterol Cal: 19 mg/dL (ref 5–40)

## 2019-07-03 LAB — T3: T3, Total: 121 ng/dL (ref 71–180)

## 2019-07-03 LAB — HEMOGLOBIN A1C
Est. average glucose Bld gHb Est-mCnc: 123 mg/dL
Hgb A1c MFr Bld: 5.9 % — ABNORMAL HIGH (ref 4.8–5.6)

## 2019-07-03 LAB — FOLATE: Folate: 12.5 ng/mL (ref >3.0)

## 2019-07-03 LAB — VITAMIN B12: Vitamin B-12: 318 pg/mL (ref 232–1245)

## 2019-07-03 LAB — INSULIN, RANDOM: INSULIN: 18 u[IU]/mL (ref 2.6–24.9)

## 2019-07-03 LAB — T4, FREE: Free T4: 1.15 ng/dL (ref 0.82–1.77)

## 2019-07-03 LAB — VITAMIN D 25 HYDROXY (VIT D DEFICIENCY, FRACTURES): Vit D, 25-Hydroxy: 11.2 ng/mL — ABNORMAL LOW (ref 30.0–100.0)

## 2019-07-03 LAB — TSH: TSH: 2.61 u[IU]/mL (ref 0.450–4.500)

## 2019-07-12 ENCOUNTER — Other Ambulatory Visit: Payer: Self-pay

## 2019-07-12 ENCOUNTER — Ambulatory Visit (INDEPENDENT_AMBULATORY_CARE_PROVIDER_SITE_OTHER): Payer: 59 | Admitting: Neurology

## 2019-07-12 ENCOUNTER — Encounter: Payer: Self-pay | Admitting: Neurology

## 2019-07-12 VITALS — BP 151/98 | HR 83 | Ht 62.0 in | Wt 213.0 lb

## 2019-07-12 DIAGNOSIS — G4719 Other hypersomnia: Secondary | ICD-10-CM | POA: Diagnosis not present

## 2019-07-12 DIAGNOSIS — R351 Nocturia: Secondary | ICD-10-CM

## 2019-07-12 DIAGNOSIS — R0681 Apnea, not elsewhere classified: Secondary | ICD-10-CM | POA: Diagnosis not present

## 2019-07-12 DIAGNOSIS — E669 Obesity, unspecified: Secondary | ICD-10-CM

## 2019-07-12 DIAGNOSIS — R0683 Snoring: Secondary | ICD-10-CM | POA: Diagnosis not present

## 2019-07-12 DIAGNOSIS — Z820 Family history of epilepsy and other diseases of the nervous system: Secondary | ICD-10-CM

## 2019-07-12 DIAGNOSIS — Z9189 Other specified personal risk factors, not elsewhere classified: Secondary | ICD-10-CM

## 2019-07-12 NOTE — Progress Notes (Signed)
Subjective:    Patient ID: Chris Harrington is a 44 y.o. male.  HPI     Star Age, MD, PhD [facility] [address]O. [place], [place] [address]  Dear Dr. Adair Patter,  I saw your patient, Chris Harrington, upon your kind request in my sleep clinic today for initial consultation of his sleep disorder, in particular, concern for underlying obstructive sleep apnea. The patient is unaccompanied today. As you know, Chris Harrington is a 44 year old right-handed gentleman with an underlying medical history of hypertension, and obesity, who reports snoring and excessive daytime somnolence. I reviewed your office note from 07/02/2019. His Epworth sleepiness score is 15 out of 24, fatigue severity score is 43 out of 63.  He was told that he has pauses in his breathing while asleep before.  He has also woken up with a sense of gasping for air infrequently.  He does not typically wake up rested.  He has significant nocturia about 5 times per average night.  He used to work night shift but has not worked overnight for at least a year.  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  Bedtime is generally around midnight and rise time between 730 and 8.  He denies recurrent morning headaches.  He has a maternal uncle with sleep apnea, not currently using a CPAP machine but has a CPAP machine as I understand.  The patient is single, lives alone, has no kids, no pets in the household.  He drinks caffeine in the form of soda, 1 can/day, drinks alcohol rarely, is a non-smoker.  He had a tonsillectomy as a child.   His Past Medical History Is Significant For: Past Medical History:  Diagnosis Date  . Hypertension     His Past Surgical History Is Significant For: Past Surgical History:  Procedure Laterality Date  . TONSILECTOMY, ADENOIDECTOMY, BILATERAL MYRINGOTOMY AND TUBES     pt has not had tubes in ears     His Family History Is Significant For: Family History  Problem Relation Age of Onset  . Hypertension Mother   . Diabetes Mother   . Heart disease Mother   . Stroke Mother   . Hypertension Father   . Cancer Paternal Grandmother        passed from unknown cancer  . Hypertension Maternal Grandmother   . Hypertension Maternal Grandfather     His Social History Is Significant For: Social History   Socioeconomic History  . Marital status: Single    Spouse name: Not on file  . Number of children: Not on file  . Years of education: Not on file  . Highest education level: Not on file  Occupational History  . Not on file  Tobacco Use  . Smoking status: Never Smoker  . Smokeless tobacco: Never Used  Substance and Sexual Activity  . Alcohol use: Yes  . Drug use: No  . Sexual activity: Yes  Other Topics Concern  . Not on file  Social History Narrative  . Not on file   Social Determinants of Health   Financial Resource Strain:   . Difficulty of Paying Living Expenses:   Food Insecurity:   . Worried About Charity fundraiser in the Last Year:   . Arboriculturist in the Last Year:   Transportation Needs:   . Film/video editor (Medical):   Marland Kitchen Lack of Transportation (  Non-Medical):   Physical Activity:   . Days of Exercise per Week:   . Minutes of Exercise per Session:   Stress:   . Feeling of Stress :   Social Connections:   . Frequency of Communication with Friends and Family:   . Frequency of Social Gatherings with Friends and Family:   . Attends Religious Services:   . Active Member of Clubs or Organizations:   . Attends Banker Meetings:   Marland Kitchen Marital Status:     His Allergies Are:  No Known Allergies:   His Current Medications Are:  Outpatient Encounter Medications as of 07/12/2019  Medication Sig  . amLODipine (NORVASC) 10 MG tablet Take 1 tablet (10 mg total) by mouth daily.  . potassium chloride SA (KLOR-CON) 20 MEQ tablet Take 1 tablet (20  mEq total) by mouth daily for 21 days.   No facility-administered encounter medications on file as of 07/12/2019.  :   Review of Systems:  Out of a complete 14 point review of systems, all are reviewed and negative with the exception of these symptoms as listed below:  Review of Systems  Neurological:       Pt presents today to discuss his sleep. Pt has never had a sleep study but does endorse snoring.  Epworth Sleepiness Scale 0= would never doze 1= slight chance of dozing 2= moderate chance of dozing 3= high chance of dozing  Sitting and reading: 3 Watching TV: 3 Sitting inactive in a public place (ex. Theater or meeting):0 As a passenger in a car for an hour without a break: 2 Lying down to rest in the afternoon: 3 Sitting and talking to someone: 1 Sitting quietly after lunch (no alcohol): 2 In a car, while stopped in traffic: 1 Total: 15    Objective:  Neurological Exam  Physical Exam Physical Examination:   Vitals:   07/12/19 1533  BP: (!) 151/98  Pulse: 83    General Examination: The patient is a very pleasant 45 y.o. male in no acute distress. He appears well-developed and well-nourished and well groomed.   HEENT: Normocephalic, atraumatic, pupils are equal, round and reactive to light, extraocular tracking is good without limitation to gaze excursion or nystagmus noted. Hearing is grossly intact. Face is symmetric with normal facial animation. Speech is clear with no dysarthria noted. There is no hypophonia. There is no lip, neck/head, jaw or voice tremor. Neck is supple with full range of passive and active motion. There are no carotid bruits on auscultation. Oropharynx exam reveals: moderate mouth dryness, good dental hygiene and moderate airway crowding, due to thicker soft palate and smaller airway entry. Mallampati is class III. Tongue protrudes centrally and palate elevates symmetrically. Tonsils are absent. Neck size is 17 3/8 inches. He has a minimal overbite.  Nasal inspection reveals right inf turbinate hypertrophy, no septal deviation.   Chest: Clear to auscultation without wheezing, rhonchi or crackles noted.  Heart: S1+S2+0, regular and normal without murmurs, rubs or gallops noted.   Abdomen: Soft, non-tender and non-distended with normal bowel sounds appreciated on auscultation.  Extremities: There is trace pitting edema in the distal lower extremities bilaterally.   Skin: Warm and dry without trophic changes noted.   Musculoskeletal: exam reveals no obvious joint deformities, tenderness or joint swelling or erythema.   Neurologically:  Mental status: The patient is awake, alert and oriented in all 4 spheres. His immediate and remote memory, attention, language skills and fund of knowledge are appropriate. There is no  evidence of aphasia, agnosia, apraxia or anomia. Speech is clear with normal prosody and enunciation. Thought process is linear. Mood is normal and affect is normal.  Cranial nerves II - XII are as described above under HEENT exam.  Motor exam: Normal bulk, strength and tone is noted. There is no tremor, Romberg is negative. Fine motor skills and coordination: grossly intact.  Cerebellar testing: No dysmetria or intention tremor. There is no truncal or gait ataxia.  Sensory exam: intact to light touch in the upper and lower extremities.  Gait, station and balance: He stands easily. No veering to one side is noted. No leaning to one side is noted. Posture is age-appropriate and stance is narrow based. Gait shows normal stride length and normal pace. No problems turning are noted. Tandem walk is unremarkable.                Assessment and plan:  In summary, Bode Pieper is a very pleasant 44 y.o.-year old male with an underlying medical history of hypertension, and obesity, whose history and physical exam concerning for obstructive sleep apnea (OSA). I had a long chat with the patient about my findings and the diagnosis of OSA,  its prognosis and treatment options. We talked about medical treatments, surgical interventions and non-pharmacological approaches. I explained in particular the risks and ramifications of untreated moderate to severe OSA, especially with respect to developing cardiovascular disease down the Road, including congestive heart failure, difficult to treat hypertension, cardiac arrhythmias, or stroke. Even type 2 diabetes has, in part, been linked to untreated OSA. Symptoms of untreated OSA include daytime sleepiness, memory problems, mood irritability and mood disorder such as depression and anxiety, lack of energy, as well as recurrent headaches, especially morning headaches. We talked about trying to maintain a healthy lifestyle in general, as well as the importance of weight control. We also talked about the importance of good sleep hygiene. I recommended the following at this time: sleep study.  I explained the sleep test procedure to the patient and also outlined possible surgical and non-surgical treatment options of OSA, including the use of a custom-made dental device (which would require a referral to a specialist dentist or oral surgeon), upper airway surgical options, such as traditional UPPP or a novel less invasive surgical option in the form of Inspire hypoglossal nerve stimulation (which would involve a referral to an ENT surgeon). I also explained the CPAP treatment option to the patient, who indicated that he would be willing to try CPAP if the need arises. I explained the importance of being compliant with PAP treatment, not only for insurance purposes but primarily to improve His symptoms, and for the patient's long term health benefit, including to reduce His cardiovascular risks. I answered all his questions today and the patient was in agreement. I plan to see him back after the sleep study is completed and encouraged him to call with any interim questions, concerns, problems or updates.    Thank you very much for allowing me to participate in the care of this nice patient. If I can be of any further assistance to you please do not hesitate to call me at [phone].  Sincerely,   Huston Foley, MD, PhD

## 2019-07-12 NOTE — Patient Instructions (Signed)

## 2019-07-16 ENCOUNTER — Other Ambulatory Visit: Payer: Self-pay

## 2019-07-16 ENCOUNTER — Encounter (INDEPENDENT_AMBULATORY_CARE_PROVIDER_SITE_OTHER): Payer: Self-pay | Admitting: Family Medicine

## 2019-07-16 ENCOUNTER — Ambulatory Visit (INDEPENDENT_AMBULATORY_CARE_PROVIDER_SITE_OTHER): Payer: 59 | Admitting: Family Medicine

## 2019-07-16 VITALS — BP 147/85 | HR 83 | Temp 98.1°F | Ht 62.0 in | Wt 209.0 lb

## 2019-07-16 DIAGNOSIS — R7303 Prediabetes: Secondary | ICD-10-CM

## 2019-07-16 DIAGNOSIS — I10 Essential (primary) hypertension: Secondary | ICD-10-CM | POA: Diagnosis not present

## 2019-07-16 DIAGNOSIS — E785 Hyperlipidemia, unspecified: Secondary | ICD-10-CM

## 2019-07-16 DIAGNOSIS — E559 Vitamin D deficiency, unspecified: Secondary | ICD-10-CM

## 2019-07-16 DIAGNOSIS — E876 Hypokalemia: Secondary | ICD-10-CM

## 2019-07-16 DIAGNOSIS — Z6838 Body mass index (BMI) 38.0-38.9, adult: Secondary | ICD-10-CM

## 2019-07-16 DIAGNOSIS — Z9189 Other specified personal risk factors, not elsewhere classified: Secondary | ICD-10-CM

## 2019-07-16 MED ORDER — VITAMIN D (ERGOCALCIFEROL) 1.25 MG (50000 UNIT) PO CAPS: 50000.0000 [IU] | ORAL_CAPSULE | ORAL | 0 refills | Status: DC

## 2019-07-16 MED ORDER — POTASSIUM CHLORIDE ER 10 MEQ PO CPCR: 20.0000 meq | ORAL_CAPSULE | Freq: Every day | ORAL | 0 refills | Status: DC

## 2019-07-17 NOTE — Progress Notes (Signed)
**Note Chris Harrington-Identified via Obfuscation** Chief Complaint:   OBESITY Chris Harrington is here to discuss his progress with his obesity treatment plan along with follow-up of his obesity related diagnoses. Chris Harrington is on the Category 3 Plan or keeping a food journal and adhering to recommended goals of 1450-1600 calories and 95+ grams of protein daily and states he is following his eating plan approximately 65% of the time. Chris Harrington states he is walking for 45 minutes 2 times per week.  Today's visit was #: 2 Starting weight: 209 lbs Starting date: 07/02/2019 Today's weight: 209 lbs Today's date: 07/16/2019 Total lbs lost to date: 0 Total lbs lost since last in-office visit: 0  Interim History: Chris Harrington thought the meal plan was harder than he thought. He notes weekends were harder to follow the plan, and he often skipped breakfast and ate out. During the work week he followed the plan for the most part. He is going to [facility] for 4 days (over next weekend).  Subjective:   1. Vitamin D deficiency Chris Harrington last Vit D level was 11.2. He is not on Vit D supplementation, and he notes fatigue. I discussed labs with the patient today.  2. Pre-diabetes Chris Harrington's last Hgb A1c was 5.9 and insulin of 18.0. This is not a new diagnosis (no prior A1c on file). I discussed labs with the patient today.  3. Dyslipidemia Chris Harrington's last HDL was 32 and LDL 122. He is not on statin. I discussed labs with the patient today.  4. Essential hypertension Chris Harrington's blood pressure is slightly elevated today. He denies chest pain, chest pressure, or headaches. He is on amlodipine (he took it today).  5. Hypokalemia Chris Harrington's K was 3.3 (he was previously on 20 meq KCI). He is not on any K+ decreasing drugs. I discussed labs with the patient today.  6. At risk for diabetes mellitus Chris Harrington is at higher than average risk for developing diabetes due to his obesity.   Assessment/Plan:   1. Vitamin D deficiency Low Vitamin D level contributes to fatigue and are  associated with obesity, breast, and colon cancer. Chris Harrington agreed to start prescription Vitamin D 50,000 IU every week with no refills. He will follow-up for routine testing of Vitamin D, at least 2-3 times per year to avoid over-replacement.  - Vitamin D, Ergocalciferol, (DRISDOL) 1.25 MG (50000 UNIT) CAPS capsule; Take 1 capsule (50,000 Units total) by mouth every 7 (seven) days.  Dispense: 4 capsule; Refill: 0  2. Pre-diabetes Chris Harrington will continue to work on weight loss, exercise, and decreasing simple carbohydrates to help decrease the risk of diabetes. We will recheck labs in 3 months. If cravings or appetite increases, may want to consider medication.  3. Dyslipidemia Cardiovascular risk and specific lipid/LDL goals reviewed. We discussed several lifestyle modifications today and Chris Harrington will continue to work on diet, exercise and weight loss efforts. We will follow up on labs in 3 months. Orders and follow up as documented in patient record.   Counseling Intensive lifestyle modifications are the first line treatment for this issue. . Dietary changes: Increase soluble fiber. Decrease simple carbohydrates. . Exercise changes: Moderate to vigorous-intensity aerobic activity 150 minutes per week if tolerated. . Lipid-lowering medications: see documented in medical record.  4. Essential hypertension Chris Harrington is working on healthy weight loss and exercise to improve blood pressure control. We will watch for signs of hypotension as he continues his lifestyle modifications. We will follow up on his blood pressure at his next appointment.  5. Hypokalemia Chris Harrington agreed to  start KCI 20 meq daily with no refills. We will check CMP at his next appointment.  - potassium chloride (MICRO-K) 10 MEQ CR capsule; Take 2 capsules (20 mEq total) by mouth daily for 21 days.  Dispense: 60 capsule; Refill: 0  6. At risk for diabetes mellitus Chris Harrington was given approximately 30 minutes of diabetes education and  counseling today. We discussed intensive lifestyle modifications today with an emphasis on weight loss as well as increasing exercise and decreasing simple carbohydrates in his diet. We also reviewed medication options with an emphasis on risk versus benefit of those discussed.   Repetitive spaced learning was employed today to elicit superior memory formation and behavioral change.  7. Class 2 severe obesity with serious comorbidity and body mass index (BMI) of 38.0 to 38.9 in adult, unspecified obesity type [name]) Chris Harrington is currently in the action stage of change. As such, his goal is to continue with weight loss efforts. He has agreed to the Category 3 Plan or keeping a food journal and adhering to recommended goals of 1450-1600 calories and 100+ grams of protein daily.   Exercise goals: No exercise has been prescribed at this time.  Behavioral modification strategies: increasing lean protein intake, increasing vegetables, meal planning and cooking strategies and avoiding temptations.  Chris Harrington has agreed to follow-up with our clinic in 2 weeks. He was informed of the importance of frequent follow-up visits to maximize his success with intensive lifestyle modifications for his multiple health conditions.   Objective:   Blood pressure (!) 147/85, pulse 83, temperature 98.1 F (36.7 C), temperature source Oral, height 5\' 2"  (1.575 m), weight 209 lb (94.8 kg), SpO2 97 %. Body mass index is 38.23 kg/m.  General: Cooperative, alert, well developed, in no acute distress. HEENT: Conjunctivae and lids unremarkable. Cardiovascular: Regular rhythm.  Lungs: Normal work of breathing. Neurologic: No focal deficits.   Lab Results  Component Value Date   CREATININE 1.19 07/02/2019   BUN 13 07/02/2019   NA 140 07/02/2019   K 3.3 (L) 07/02/2019   CL 100 07/02/2019   CO2 25 07/02/2019   Lab Results  Component Value Date   ALT 19 07/02/2019   AST 19 07/02/2019   ALKPHOS 85 07/02/2019   BILITOT  0.7 07/02/2019   Lab Results  Component Value Date   HGBA1C 5.9 (H) 07/02/2019   Lab Results  Component Value Date   INSULIN 18.0 07/02/2019   Lab Results  Component Value Date   TSH 2.610 07/02/2019   Lab Results  Component Value Date   CHOL 173 07/02/2019   HDL 32 (L) 07/02/2019   LDLCALC 122 (H) 07/02/2019   TRIG 101 07/02/2019   CHOLHDL 5 02/15/2013   Lab Results  Component Value Date   WBC 6.4 07/02/2019   HGB 14.3 07/02/2019   HCT 43.0 07/02/2019   MCV 88 07/02/2019   PLT 302 07/02/2019   No results found for: IRON, TIBC, FERRITIN  Attestation Statements:   Reviewed by clinician on day of visit: allergies, medications, problem list, medical history, surgical history, family history, social history, and previous encounter notes.   I, Trixie Dredge, am acting as transcriptionist for April Manson, MD.  I have reviewed the above documentation for accuracy and completeness, and I agree with the above. - Ilene Qua, MD

## 2019-07-31 ENCOUNTER — Ambulatory Visit (INDEPENDENT_AMBULATORY_CARE_PROVIDER_SITE_OTHER): Payer: 59 | Admitting: Family Medicine

## 2019-07-31 ENCOUNTER — Other Ambulatory Visit: Payer: Self-pay

## 2019-07-31 ENCOUNTER — Encounter (INDEPENDENT_AMBULATORY_CARE_PROVIDER_SITE_OTHER): Payer: Self-pay | Admitting: Family Medicine

## 2019-07-31 VITALS — BP 132/77 | HR 84 | Temp 98.5°F | Ht 62.0 in | Wt 208.0 lb

## 2019-07-31 DIAGNOSIS — E559 Vitamin D deficiency, unspecified: Secondary | ICD-10-CM | POA: Diagnosis not present

## 2019-07-31 DIAGNOSIS — Z9189 Other specified personal risk factors, not elsewhere classified: Secondary | ICD-10-CM | POA: Diagnosis not present

## 2019-07-31 DIAGNOSIS — Z6838 Body mass index (BMI) 38.0-38.9, adult: Secondary | ICD-10-CM

## 2019-07-31 DIAGNOSIS — I10 Essential (primary) hypertension: Secondary | ICD-10-CM

## 2019-07-31 MED ORDER — AMLODIPINE BESYLATE 10 MG PO TABS: 10.0000 mg | ORAL_TABLET | Freq: Every day | ORAL | 0 refills | Status: DC

## 2019-07-31 MED ORDER — TOPIRAMATE 25 MG PO TABS: 25.0000 mg | ORAL_TABLET | Freq: Every day | ORAL | 0 refills | Status: DC

## 2019-08-01 NOTE — Progress Notes (Signed)
Chief Complaint:   OBESITY Chris Harrington is here to discuss his progress with his obesity treatment plan along with follow-up of his obesity related diagnoses. Chris Harrington is on the Category 3 Plan and states he is following his eating plan approximately 0% of the time. Steward states he is walking for 60 minutes 1 times per week.  Today's visit was #: 3 Starting weight: 209 lbs Starting date: 07/02/2019 Today's weight: 208 lbs Today's date: 07/31/2019 Total lbs lost to date: 1 lb Total lbs lost since last in-office visit: 1 lb  Interim History: [name] says he hasn't cooked or followed the plan.  He has been doing mostly baked or fried chicken, pizza, or [employer].  He also went out of town and voices that he struggled, particularly after returning.  Subjective:   1. Essential hypertension Review: taking medications as instructed, no medication side effects noted, no chest pain on exertion, no dyspnea on exertion, no swelling of ankles.  Blood pressure is controlled today.  BP Readings from Last 3 Encounters:  07/31/19 132/77  07/16/19 (!) 147/85  07/12/19 (!) 151/98   2. Vitamin D deficiency Chris Harrington's Vitamin D level was 11.2 on 3/292021. He is currently taking no vitamin D supplement. He denies nausea, vomiting or muscle weakness.  He endorses fatigue.  3. At risk for heart disease Chris Harrington is at a higher than average risk for cardiovascular disease due to obesity.   Assessment/Plan:   1. Essential hypertension Mang is working on healthy weight loss and exercise to improve blood pressure control. We will watch for signs of hypotension as he continues his lifestyle modifications. - amLODipine (NORVASC) 10 MG tablet; Take 1 tablet (10 mg total) by mouth daily.  Dispense: 30 tablet; Refill: 0  2. Vitamin D deficiency Low Vitamin D level contributes to fatigue and are associated with obesity, breast, and colon cancer. He agrees to start to take prescription Vitamin D @50 ,000 IU every  week and will follow-up for routine testing of Vitamin D, at least 2-3 times per year to avoid over-replacement.  3. At risk for heart disease [name] was given approximately 15 minutes of coronary artery disease prevention counseling today. He is 44 y.o. male and has risk factors for heart disease including obesity. We discussed intensive lifestyle modifications today with an emphasis on specific weight loss instructions and strategies.   Repetitive spaced learning was employed today to elicit superior memory formation and behavioral change.  4. Class 2 severe obesity with serious comorbidity and body mass index (BMI) of 38.0 to 38.9 in adult, unspecified obesity type (HCC)  - topiramate (TOPAMAX) 25 MG tablet; Take 1 tablet (25 mg total) by mouth daily with supper.  Dispense: 30 tablet; Refill: 0  Chris Harrington is currently in the action stage of change. As such, his goal is to continue with weight loss efforts. He has agreed to keeping a food journal and adhering to recommended goals of 1400-1500 calories calories and 100+ grams of protein.   Exercise goals: No exercise has been prescribed at this time.  Behavioral modification strategies: increasing lean protein intake, meal planning and cooking strategies, keeping healthy foods in the home and planning for success.  Chris Harrington has agreed to follow-up with our clinic in 2 weeks. He was informed of the importance of frequent follow-up visits to maximize his success with intensive lifestyle modifications for his multiple health conditions.   Objective:   Blood pressure 132/77, pulse 84, temperature 98.5 F (36.9 C), temperature source Oral, height  5\' 2"  (1.575 m), weight 208 lb (94.3 kg), SpO2 96 %. Body mass index is 38.04 kg/m.  General: Cooperative, alert, well developed, in no acute distress. HEENT: Conjunctivae and lids unremarkable. Cardiovascular: Regular rhythm.  Lungs: Normal work of breathing. Neurologic: No focal deficits.   Lab  Results  Component Value Date   CREATININE 1.19 07/02/2019   BUN 13 07/02/2019   NA 140 07/02/2019   K 3.3 (L) 07/02/2019   CL 100 07/02/2019   CO2 25 07/02/2019   Lab Results  Component Value Date   ALT 19 07/02/2019   AST 19 07/02/2019   ALKPHOS 85 07/02/2019   BILITOT 0.7 07/02/2019   Lab Results  Component Value Date   HGBA1C 5.9 (H) 07/02/2019   Lab Results  Component Value Date   INSULIN 18.0 07/02/2019   Lab Results  Component Value Date   TSH 2.610 07/02/2019   Lab Results  Component Value Date   CHOL 173 07/02/2019   HDL 32 (L) 07/02/2019   LDLCALC 122 (H) 07/02/2019   TRIG 101 07/02/2019   CHOLHDL 5 02/15/2013   Lab Results  Component Value Date   WBC 6.4 07/02/2019   HGB 14.3 07/02/2019   HCT 43.0 07/02/2019   MCV 88 07/02/2019   PLT 302 07/02/2019   Attestation Statements:   Reviewed by clinician on day of visit: allergies, medications, problem list, medical history, surgical history, family history, social history, and previous encounter notes.  I, Water quality scientist, CMA, am acting as transcriptionist for Coralie Common, MD.  I have reviewed the above documentation for accuracy and completeness, and I agree with the above. - Ilene Qua, MD

## 2019-08-09 ENCOUNTER — Other Ambulatory Visit (INDEPENDENT_AMBULATORY_CARE_PROVIDER_SITE_OTHER): Payer: Self-pay | Admitting: Family Medicine

## 2019-08-09 ENCOUNTER — Telehealth: Payer: Self-pay

## 2019-08-09 ENCOUNTER — Encounter (INDEPENDENT_AMBULATORY_CARE_PROVIDER_SITE_OTHER): Payer: Self-pay | Admitting: Family Medicine

## 2019-08-09 DIAGNOSIS — I10 Essential (primary) hypertension: Secondary | ICD-10-CM

## 2019-08-16 ENCOUNTER — Other Ambulatory Visit (INDEPENDENT_AMBULATORY_CARE_PROVIDER_SITE_OTHER): Payer: Self-pay | Admitting: Family Medicine

## 2019-08-16 DIAGNOSIS — E876 Hypokalemia: Secondary | ICD-10-CM

## 2019-08-21 ENCOUNTER — Ambulatory Visit (INDEPENDENT_AMBULATORY_CARE_PROVIDER_SITE_OTHER): Payer: 59 | Admitting: Family Medicine

## 2019-09-03 ENCOUNTER — Other Ambulatory Visit (INDEPENDENT_AMBULATORY_CARE_PROVIDER_SITE_OTHER): Payer: Self-pay | Admitting: Family Medicine

## 2019-09-03 DIAGNOSIS — I10 Essential (primary) hypertension: Secondary | ICD-10-CM

## 2019-09-03 DIAGNOSIS — Z6838 Body mass index (BMI) 38.0-38.9, adult: Secondary | ICD-10-CM

## 2019-09-10 ENCOUNTER — Ambulatory Visit: Payer: Self-pay | Attending: Internal Medicine

## 2019-09-10 DIAGNOSIS — Z20822 Contact with and (suspected) exposure to covid-19: Secondary | ICD-10-CM

## 2019-09-11 LAB — NOVEL CORONAVIRUS, NAA: SARS-CoV-2, NAA: NOT DETECTED

## 2019-09-11 LAB — SARS-COV-2, NAA 2 DAY TAT

## 2019-09-19 ENCOUNTER — Emergency Department (HOSPITAL_BASED_OUTPATIENT_CLINIC_OR_DEPARTMENT_OTHER)
Admission: EM | Admit: 2019-09-19 | Discharge: 2019-09-19 | Disposition: A | Discharge status: Home / Self Care | Payer: 59 | Attending: Emergency Medicine | Admitting: Emergency Medicine

## 2019-09-19 ENCOUNTER — Emergency Department (HOSPITAL_BASED_OUTPATIENT_CLINIC_OR_DEPARTMENT_OTHER): Payer: 59

## 2019-09-19 ENCOUNTER — Other Ambulatory Visit: Payer: Self-pay

## 2019-09-19 ENCOUNTER — Encounter (HOSPITAL_BASED_OUTPATIENT_CLINIC_OR_DEPARTMENT_OTHER): Payer: Self-pay | Admitting: *Deleted

## 2019-09-19 DIAGNOSIS — M25572 Pain in left ankle and joints of left foot: Secondary | ICD-10-CM | POA: Diagnosis present

## 2019-09-19 DIAGNOSIS — M25571 Pain in right ankle and joints of right foot: Secondary | ICD-10-CM

## 2019-09-19 DIAGNOSIS — I10 Essential (primary) hypertension: Secondary | ICD-10-CM | POA: Diagnosis not present

## 2019-09-19 DIAGNOSIS — R030 Elevated blood-pressure reading, without diagnosis of hypertension: Secondary | ICD-10-CM

## 2019-09-19 MED ORDER — AMLODIPINE BESYLATE 10 MG PO TABS: 10.0000 mg | ORAL_TABLET | Freq: Every day | ORAL | 0 refills | Status: DC

## 2019-09-19 MED ORDER — ACETAMINOPHEN 325 MG PO TABS
650.0000 mg | ORAL_TABLET | Freq: Once | ORAL | Status: AC
  Administered 2019-09-19: 650 mg via ORAL
  Filled 2019-09-19: qty 2

## 2019-09-19 MED ORDER — AMLODIPINE BESYLATE 5 MG PO TABS
5.0000 mg | ORAL_TABLET | Freq: Once | ORAL | Status: AC
  Administered 2019-09-19: 5 mg via ORAL
  Filled 2019-09-19: qty 1

## 2019-09-19 NOTE — ED Triage Notes (Addendum)
Left lower leg and and ankle pain x 3 days   States has had same problem off and on x 5 months   Has not been seen for this    (has not had BP meds in 5 days  He is out of them)

## 2019-09-19 NOTE — Discharge Instructions (Signed)
At this time there does not appear to be the presence of an emergent medical condition, however there is always the potential for conditions to change. Please read and follow the below instructions.  Please return to the Emergency Department immediately for any new or worsening symptoms. Please be sure to follow up with your Primary Care Provider within one week regarding your visit today; please call their office to schedule an appointment even if you are feeling better for a follow-up visit. Your blood pressure medicine was refilled today.  Please see your primary care doctor in the next week for blood pressure recheck and medication management. Please call the sports medicine doctor, Dr. Jordan Likes for follow-up regarding your ankle pain.  Use rest ice and elevation to help with pain and swelling.  You may use the crutches in the boot over the next few days as needed to help with your pain.  Get help right away if you: Get a very bad headache. Start to feel mixed up (confused). Feel weak or numb. Feel faint. Have very bad pain in your: Chest. Belly (abdomen). Throw up more than once. Have trouble breathing. Your foot, leg, toes, or ankle: Tingles or becomes numb. Becomes swollen. Turns pale or blue. You have swelling into your calf and leg You have any new/concerning or worsening of symptoms.  Please read the additional information packets attached to your discharge summary.  Do not take your medicine if  develop an itchy rash, swelling in your mouth or lips, or difficulty breathing; call 911 and seek immediate emergency medical attention if this occurs.  Note: Portions of this text may have been transcribed using voice recognition software. Every effort was made to ensure accuracy; however, inadvertent computerized transcription errors may still be present.

## 2019-09-19 NOTE — ED Notes (Signed)
Patient transported to X-ray 

## 2019-09-19 NOTE — ED Provider Notes (Signed)
[facility] EMERGENCY DEPARTMENT Provider Note   CSN: [id number] Arrival date & time: 09/19/19  1840     History Chief Complaint  Patient presents with  . Leg Pain    Chris Harrington is a 44 y.o. male history of hypertension otherwise healthy.  Patient reports he has had left ankle pain for the last 2 days, he describes an aching sensation when he walks improved with rest and elevation, nonradiating moderate intensity.  Patient reports he had this problem around 2 months ago, cannot recall a specific injury reports that the pain at that time resolved after 2 days with rest and anti-inflammatories.  Associated with swelling.  Additionally patient is requesting refill of his blood pressure medication amlodipine 10 mg.  He denies fever/chills, fall/injury, chest pain/shortness of breath, numbness/tingling, weakness, headache/vision changes or additional concerns.  HPI     Past Medical History:  Diagnosis Date  . Hypertension     Patient Active Problem List   Diagnosis Date Noted  . Elevated BP 02/15/2013  . Cough 02/15/2013    Past Surgical History:  Procedure Laterality Date  . TONSILECTOMY, ADENOIDECTOMY, BILATERAL MYRINGOTOMY AND TUBES     pt has not had tubes in ears       Family History  Problem Relation Age of Onset  . Hypertension Mother   . Diabetes Mother   . Heart disease Mother   . Stroke Mother   . Hypertension Father   . Cancer Paternal Grandmother        passed from unknown cancer  . Hypertension Maternal Grandmother   . Hypertension Maternal Grandfather     Social History   Tobacco Use  . Smoking status: Never Smoker  . Smokeless tobacco: Never Used  Substance Use Topics  . Alcohol use: Yes  . Drug use: No    Home Medications Prior to Admission medications   Medication Sig Start Date End Date Taking? Authorizing Provider  amLODipine (NORVASC) 10 MG tablet Take 1 tablet (10 mg total) by mouth daily. 09/19/19   Harlene Salts A,  PA-C  potassium chloride (MICRO-K) 10 MEQ CR capsule Take 2 capsules (20 mEq total) by mouth daily for 21 days. 07/16/19 08/06/19  Filbert Schilder, MD  topiramate (TOPAMAX) 25 MG tablet Take 1 tablet (25 mg total) by mouth daily with supper. 07/31/19   Filbert Schilder, MD  Vitamin D, Ergocalciferol, (DRISDOL) 1.25 MG (50000 UNIT) CAPS capsule Take 1 capsule (50,000 Units total) by mouth every 7 (seven) days. 07/16/19   Filbert Schilder, MD  potassium chloride SA (KLOR-CON) 20 MEQ tablet Take 1 tablet (20 mEq total) by mouth daily for 21 days. 03/25/19 07/16/19  Terald Sleeper, MD    Allergies    Patient has no known allergies.  Review of Systems   Review of Systems  Constitutional: Negative for chills and fever.  Cardiovascular: Negative.  Negative for chest pain.  Gastrointestinal: Negative.  Negative for abdominal pain.  Musculoskeletal: Positive for arthralgias (Left ankle). Negative for back pain and neck pain.  Skin: Negative.  Negative for color change and wound.  Neurological: Negative.  Negative for weakness, numbness and headaches.    Physical Exam Updated Vital Signs BP (!) 150/109 (BP Location: Right Arm)   Pulse 69   Temp 98.3 F (36.8 C) (Oral)   Resp 19   Ht 5\' 2"  (1.575 m)   Wt 90.7 kg   SpO2 100%   BMI 36.58 kg/m   Physical Exam Constitutional:  General: He is not in acute distress.    Appearance: Normal appearance. He is well-developed. He is not ill-appearing or diaphoretic.  HENT:     Head: Normocephalic and atraumatic.  Eyes:     General: Vision grossly intact. Gaze aligned appropriately.     Pupils: Pupils are equal, round, and reactive to light.  Neck:     Trachea: Trachea and phonation normal.  Cardiovascular:     Pulses:          Dorsalis pedis pulses are 2+ on the right side and 2+ on the left side.  Pulmonary:     Effort: Pulmonary effort is normal. No respiratory distress.  Abdominal:     General: There is no distension.       Palpations: Abdomen is soft.     Tenderness: There is no abdominal tenderness. There is no guarding or rebound.  Musculoskeletal:        General: Normal range of motion.     Cervical back: Normal range of motion.     Right knee: Normal.     Left knee: Normal.     Right lower leg: Normal. No swelling or tenderness. No edema.     Left lower leg: Normal. No swelling or tenderness. No edema.     Right ankle: Normal.     Right Achilles Tendon: Normal.     Left ankle: Swelling (Trace swelling compared to the right) present. Tenderness present over the medial malleolus.     Left Achilles Tendon: Normal.     Right foot: Normal.     Left foot: Normal.  Feet:     Right foot:     Protective Sensation: 5 sites tested. 5 sites sensed.     Skin integrity: Skin integrity normal.     Left foot:     Protective Sensation: 5 sites tested. 5 sites sensed.     Skin integrity: Skin integrity normal.  Skin:    General: Skin is warm and dry.  Neurological:     Mental Status: He is alert.     GCS: GCS eye subscore is 4. GCS verbal subscore is 5. GCS motor subscore is 6.     Comments: Speech is clear and goal oriented, follows commands Major Cranial nerves without deficit, no facial droop Moves extremities without ataxia, coordination intact  Psychiatric:        Behavior: Behavior normal.     ED Results / Procedures / Treatments   Labs (all labs ordered are listed, but only abnormal results are displayed) Labs Reviewed - No data to display  EKG None  Radiology DG Ankle Complete Left  Result Date: 09/19/2019 CLINICAL DATA:  Medial malleolar pain for 2 days EXAM: LEFT ANKLE COMPLETE - 3+ VIEW COMPARISON:  None. FINDINGS: Frontal, oblique, and lateral views of the left ankle demonstrate no fractures. Alignment is anatomic. Joint spaces are well preserved. Diffuse soft tissue edema greatest medially. IMPRESSION: 1. Medial soft tissue swelling.  No acute bony abnormality. Electronically Signed    By: Sharlet Salina M.D.   On: 09/19/2019 21:59    Procedures Procedures (including critical care time)  Medications Ordered in ED Medications  acetaminophen (TYLENOL) tablet 650 mg (650 mg Oral Given 09/19/19 2144)  amLODipine (NORVASC) tablet 5 mg (5 mg Oral Given 09/19/19 2144)    ED Course  I have reviewed the triage vital signs and the nursing notes.  Pertinent labs & imaging results that were available during my care of the patient were reviewed  by me and considered in my medical decision making (see chart for details).    MDM Rules/Calculators/A&P                          Additional History Obtained: 1. Nursing notes from this visit. 2. No pertinent recent ED visits. 3. Most recent family medicine office visit on July 31, 2019 shows patient is to take amlodipine 10 mg once daily. - DG Left Ankle:  IMPRESSION:  1. Medial soft tissue swelling. No acute bony abnormality.  I have personally reviewed patient's left ankle x-ray and agree with radiologist interpretation. - Suspect patient with likely sprain of the left ankle.  No calf tenderness or Homans' sign, no color change.  No evidence of DVT, compartment syndrome, septic arthritis, cellulitis, gross ligamentous laxity or vascular compromise or other emergent pathologies at this time.  Patient will be given crutches and boot and referred to sports medicine for follow-up.  Patient is requesting refill of his daily blood pressure medication.  He is mildly hypertensive in the ER at 158/109.  He has no symptoms suggestive of hypertensive urgency or emergency.  I discussed signs/symptoms of hypertensive urgency and emergency with the patient and informed him to return to the emergency department if they occur and he states understanding.  He is currently asymptomatic regarding his elevated blood pressure.  Amlodipine refilled.  Patient informed to follow-up with his PCP this week for blood pressure recheck.   At this time there  does not appear to be any evidence of an acute emergency medical condition and the patient appears stable for discharge with appropriate outpatient follow up. Diagnosis was discussed with patient who verbalizes understanding of care plan and is agreeable to discharge. I have discussed return precautions with patient who verbalizes understanding. Patient encouraged to follow-up with their PCP and sports med. All questions answered.   Note: Portions of this report may have been transcribed using voice recognition software. Every effort was made to ensure accuracy; however, inadvertent computerized transcription errors may still be present. Final Clinical Impression(s) / ED Diagnoses Final diagnoses:  Acute right ankle pain  Elevated blood pressure reading    Rx / DC Orders ED Discharge Orders         Ordered    amLODipine (NORVASC) 10 MG tablet  Daily     Discontinue  Reprint     09/19/19 2323           Deliah Boston, PA-C 09/19/19 2324    Tegeler, Gwenyth Allegra, MD 09/19/19 2349

## 2019-09-26 ENCOUNTER — Other Ambulatory Visit: Payer: Self-pay

## 2019-09-27 ENCOUNTER — Ambulatory Visit: Payer: 59 | Admitting: Internal Medicine

## 2019-09-27 ENCOUNTER — Encounter: Payer: Self-pay | Admitting: Internal Medicine

## 2019-09-27 DIAGNOSIS — I10 Essential (primary) hypertension: Secondary | ICD-10-CM | POA: Insufficient documentation

## 2019-09-27 DIAGNOSIS — Z6836 Body mass index (BMI) 36.0-36.9, adult: Secondary | ICD-10-CM

## 2019-09-27 DIAGNOSIS — E669 Obesity, unspecified: Secondary | ICD-10-CM | POA: Insufficient documentation

## 2019-09-27 MED ORDER — AMLODIPINE BESYLATE 10 MG PO TABS: 10.0000 mg | ORAL_TABLET | Freq: Every day | ORAL | 1 refills | Status: DC

## 2019-09-27 NOTE — Progress Notes (Signed)
New Patient Office Visit     This visit occurred during the SARS-CoV-2 public health emergency.  Safety protocols were in place, including screening questions prior to the visit, additional usage of staff PPE, and extensive cleaning of exam room while observing appropriate contact time as indicated for disinfecting solutions.    CC/Reason for Visit: Establish care, discuss chronic conditions, medication refills Previous PCP: None Last Visit: Unknown  HPI: Chris Harrington is a 44 y.o. male who is coming in today for the above mentioned reasons. Past Medical History is significant for: Hypertension that has been well controlled on amlodipine 10 mg and obesity.  He does not have a primary care doctor and has been getting refills of his amlodipine through different urgent care and emergency departments.  [facility] but [facility].  He has no complaints today.  He is not interested in receiving his Covid vaccination.  He needs refills of his amlodipine today.   Past Medical/Surgical History: Past Medical History:  Diagnosis Date  . Hypertension   . Obesity     Past Surgical History:  Procedure Laterality Date  . TONSILECTOMY, ADENOIDECTOMY, BILATERAL MYRINGOTOMY AND TUBES     pt has not had tubes in ears    Social History:  reports that he has never smoked. He has never used smokeless tobacco. He reports current alcohol use. He reports that he does not use drugs.  Allergies: No Known Allergies  Family History:  Family History  Problem Relation Age of Onset  . Hypertension Mother   . Diabetes Mother   . Heart disease Mother   . Stroke Mother   . Hypertension Father   . Cancer Paternal Grandmother        passed from unknown cancer  . Hypertension Maternal Grandmother   . Hypertension Maternal Grandfather      Current Outpatient Medications:  .  amLODipine (NORVASC) 10 MG tablet, Take 1 tablet (10 mg total) by mouth  daily., Disp: 90 tablet, Rfl: 1  Review of Systems:  Constitutional: Denies fever, chills, diaphoresis, appetite change and fatigue.  HEENT: Denies photophobia, eye pain, redness, hearing loss, ear pain, congestion, sore throat, rhinorrhea, sneezing, mouth sores, trouble swallowing, neck pain, neck stiffness and tinnitus.   Respiratory: Denies SOB, DOE, cough, chest tightness,  and wheezing.   Cardiovascular: Denies chest pain, palpitations and leg swelling.  Gastrointestinal: Denies nausea, vomiting, abdominal pain, diarrhea, constipation, blood in stool and abdominal distention.  Genitourinary: Denies dysuria, urgency, frequency, hematuria, flank pain and difficulty urinating.  Endocrine: Denies: hot or cold intolerance, sweats, changes in hair or nails, polyuria, polydipsia. Musculoskeletal: Denies myalgias, back pain, joint swelling, arthralgias and gait problem.  Skin: Denies pallor, rash and wound.  Neurological: Denies dizziness, seizures, syncope, weakness, light-headedness, numbness and headaches.  Hematological: Denies adenopathy. Easy bruising, personal or family bleeding history  Psychiatric/Behavioral: Denies suicidal ideation, mood changes, confusion, nervousness, sleep disturbance and agitation    Physical Exam: Vitals:   09/27/19 1255  BP: 110/80  Pulse: 89  Temp: (!) 97.1 F (36.2 C)  TempSrc: Temporal  SpO2: 97%  Weight: 215 lb 3.2 oz (97.6 kg)  Height: 5\' 2"  (1.575 m)   Body mass index is 39.36 kg/m.  Constitutional: NAD, calm, comfortable Eyes: PERRL, lids and conjunctivae normal ENMT: Mucous membranes are moist.  Respiratory: clear to auscultation bilaterally, no wheezing, no crackles. Normal respiratory effort. No accessory muscle use.  Cardiovascular: Regular rate and  rhythm, no murmurs / rubs / gallops. No extremity edema. Neurologic: Grossly intact and nonfocal Psychiatric: Normal judgment and insight. Alert and oriented x 3. Normal mood.     Impression and Plan:  Essential hypertension -Well-controlled, refill amlodipine.  Class 2 severe obesity due to excess calories with serious comorbidity and body mass index (BMI) of 36.0 to 36.9 in adult [facility]) -Discussed healthy lifestyle, including increased physical activity and better food choices to promote weight loss. -Have encouraged him to resume care at the [facility] center.  He will schedule follow-up in 3 to 4 months for his annual physical.    Patient Instructions  -Nice seeing you today!!  -Schedule follow up in 4 months for your physical. Please come in fasting that day.  -Low salt diet (see below).   DASH Eating Plan DASH stands for "Dietary Approaches to Stop Hypertension." The DASH eating plan is a healthy eating plan that has been shown to reduce high blood pressure (hypertension). It may also reduce your risk for type 2 diabetes, heart disease, and stroke. The DASH eating plan may also help with weight loss. What are tips for following this plan?  General guidelines  Avoid eating more than 2,300 mg (milligrams) of salt (sodium) a day. If you have hypertension, you may need to reduce your sodium intake to 1,500 mg a day.  Limit alcohol intake to no more than 1 drink a day for nonpregnant women and 2 drinks a day for men. One drink equals 12 oz of beer, 5 oz of wine, or 1 oz of hard liquor.  Work with your health care provider to maintain a healthy body weight or to lose weight. Ask what an ideal weight is for you.  Get at least 30 minutes of exercise that causes your heart to beat faster (aerobic exercise) most days of the week. Activities may include walking, swimming, or biking.  Work with your health care provider or diet and nutrition specialist (dietitian) to adjust your eating plan to your individual calorie needs. Reading food labels   Check food labels for the amount of sodium per serving. Choose foods with less than 5 percent of the  Daily Value of sodium. Generally, foods with less than 300 mg of sodium per serving fit into this eating plan.  To find whole grains, look for the word "whole" as the first word in the ingredient list. Shopping  Buy products labeled as "low-sodium" or "no salt added."  Buy fresh foods. Avoid canned foods and premade or frozen meals. Cooking  Avoid adding salt when cooking. Use salt-free seasonings or herbs instead of table salt or sea salt. Check with your health care provider or pharmacist before using salt substitutes.  Do not fry foods. Cook foods using healthy methods such as baking, boiling, grilling, and broiling instead.  Cook with heart-healthy oils, such as olive, canola, soybean, or sunflower oil. Meal planning  Eat a balanced diet that includes: ? 5 or more servings of fruits and vegetables each day. At each meal, try to fill half of your plate with fruits and vegetables. ? Up to 6-8 servings of whole grains each day. ? Less than 6 oz of lean meat, poultry, or fish each day. A 3-oz serving of meat is about the same size as a deck of cards. One egg equals 1 oz. ? 2 servings of low-fat dairy each day. ? A serving of nuts, seeds, or beans 5 times each week. ? Heart-healthy fats. Healthy fats called  Omega-3 fatty acids are found in foods such as flaxseeds and coldwater fish, like sardines, salmon, and mackerel.  Limit how much you eat of the following: ? Canned or prepackaged foods. ? Food that is high in trans fat, such as fried foods. ? Food that is high in saturated fat, such as fatty meat. ? Sweets, desserts, sugary drinks, and other foods with added sugar. ? Full-fat dairy products.  Do not salt foods before eating.  Try to eat at least 2 vegetarian meals each week.  Eat more home-cooked food and less restaurant, buffet, and fast food.  When eating at a restaurant, ask that your food be prepared with less salt or no salt, if possible. What foods are  recommended? The items listed may not be a complete list. Talk with your dietitian about what dietary choices are best for you. Grains Whole-grain or whole-wheat bread. Whole-grain or whole-wheat pasta. Brown rice. Orpah Cobb. Bulgur. Whole-grain and low-sodium cereals. Pita bread. Low-fat, low-sodium crackers. Whole-wheat flour tortillas. Vegetables Fresh or frozen vegetables (raw, steamed, roasted, or grilled). Low-sodium or reduced-sodium tomato and vegetable juice. Low-sodium or reduced-sodium tomato sauce and tomato paste. Low-sodium or reduced-sodium canned vegetables. Fruits All fresh, dried, or frozen fruit. Canned fruit in natural juice (without added sugar). Meat and other protein foods Skinless chicken or [place]. Ground chicken or [place]. Pork with fat trimmed off. Fish and seafood. Egg whites. Dried beans, peas, or lentils. Unsalted nuts, nut butters, and seeds. Unsalted canned beans. Lean cuts of beef with fat trimmed off. Low-sodium, lean deli meat. Dairy Low-fat (1%) or fat-free (skim) milk. Fat-free, low-fat, or reduced-fat cheeses. Nonfat, low-sodium ricotta or cottage cheese. Low-fat or nonfat yogurt. Low-fat, low-sodium cheese. Fats and oils Soft margarine without trans fats. Vegetable oil. Low-fat, reduced-fat, or light mayonnaise and salad dressings (reduced-sodium). Canola, safflower, olive, soybean, and sunflower oils. Avocado. Seasoning and other foods Herbs. Spices. Seasoning mixes without salt. Unsalted popcorn and pretzels. Fat-free sweets. What foods are not recommended? The items listed may not be a complete list. Talk with your dietitian about what dietary choices are best for you. Grains Baked goods made with fat, such as croissants, muffins, or some breads. Dry pasta or rice meal packs. Vegetables Creamed or fried vegetables. Vegetables in a cheese sauce. Regular canned vegetables (not low-sodium or reduced-sodium). Regular canned tomato sauce and paste (not  low-sodium or reduced-sodium). Regular tomato and vegetable juice (not low-sodium or reduced-sodium). Rosita Fire. Olives. Fruits Canned fruit in a light or heavy syrup. Fried fruit. Fruit in cream or butter sauce. Meat and other protein foods Fatty cuts of meat. Ribs. Fried meat. Tomasa Blase. Sausage. Bologna and other processed lunch meats. Salami. Fatback. Hotdogs. Bratwurst. Salted nuts and seeds. Canned beans with added salt. Canned or smoked fish. Whole eggs or egg yolks. Chicken or [place] with skin. Dairy Whole or 2% milk, cream, and half-and-half. Whole or full-fat cream cheese. Whole-fat or sweetened yogurt. Full-fat cheese. Nondairy creamers. Whipped toppings. Processed cheese and cheese spreads. Fats and oils Butter. Stick margarine. Lard. Shortening. Ghee. Bacon fat. Tropical oils, such as coconut, palm kernel, or palm oil. Seasoning and other foods Salted popcorn and pretzels. Onion salt, garlic salt, seasoned salt, table salt, and sea salt. Worcestershire sauce. Tartar sauce. Barbecue sauce. Teriyaki sauce. Soy sauce, including reduced-sodium. Steak sauce. Canned and packaged gravies. Fish sauce. Oyster sauce. Cocktail sauce. Horseradish that you find on the shelf. Ketchup. Mustard. Meat flavorings and tenderizers. Bouillon cubes. Hot sauce and Tabasco sauce. Premade or packaged marinades. Premade  or packaged taco seasonings. Relishes. Regular salad dressings. Where to find more information:  National Heart, Lung, and Blood Institute: PopSteam.is  American Heart Association: www.heart.org Summary  The DASH eating plan is a healthy eating plan that has been shown to reduce high blood pressure (hypertension). It may also reduce your risk for type 2 diabetes, heart disease, and stroke.  With the DASH eating plan, you should limit salt (sodium) intake to 2,300 mg a day. If you have hypertension, you may need to reduce your sodium intake to 1,500 mg a day.  When on the DASH eating plan,  aim to eat more fresh fruits and vegetables, whole grains, lean proteins, low-fat dairy, and heart-healthy fats.  Work with your health care provider or diet and nutrition specialist (dietitian) to adjust your eating plan to your individual calorie needs. This information is not intended to replace advice given to you by your health care provider. Make sure you discuss any questions you have with your health care provider. Document Revised: 03/04/2017 Document Reviewed: 03/15/2016 Elsevier Patient Education  2020 Elsevier Inc.      Chaya Jan, MD [facility] at [facility]

## 2019-09-27 NOTE — Patient Instructions (Signed)
-Nice seeing you today!!  -Schedule follow up in 4 months for your physical. Please come in fasting that day.  -Low salt diet (see below).   DASH Eating Plan DASH stands for "Dietary Approaches to Stop Hypertension." The DASH eating plan is a healthy eating plan that has been shown to reduce high blood pressure (hypertension). It may also reduce your risk for type 2 diabetes, heart disease, and stroke. The DASH eating plan may also help with weight loss. What are tips for following this plan?  General guidelines  Avoid eating more than 2,300 mg (milligrams) of salt (sodium) a day. If you have hypertension, you may need to reduce your sodium intake to 1,500 mg a day.  Limit alcohol intake to no more than 1 drink a day for nonpregnant women and 2 drinks a day for men. One drink equals 12 oz of beer, 5 oz of wine, or 1 oz of hard liquor.  Work with your health care provider to maintain a healthy body weight or to lose weight. Ask what an ideal weight is for you.  Get at least 30 minutes of exercise that causes your heart to beat faster (aerobic exercise) most days of the week. Activities may include walking, swimming, or biking.  Work with your health care provider or diet and nutrition specialist (dietitian) to adjust your eating plan to your individual calorie needs. Reading food labels   Check food labels for the amount of sodium per serving. Choose foods with less than 5 percent of the Daily Value of sodium. Generally, foods with less than 300 mg of sodium per serving fit into this eating plan.  To find whole grains, look for the word "whole" as the first word in the ingredient list. Shopping  Buy products labeled as "low-sodium" or "no salt added."  Buy fresh foods. Avoid canned foods and premade or frozen meals. Cooking  Avoid adding salt when cooking. Use salt-free seasonings or herbs instead of table salt or sea salt. Check with your health care provider or pharmacist before  using salt substitutes.  Do not fry foods. Cook foods using healthy methods such as baking, boiling, grilling, and broiling instead.  Cook with heart-healthy oils, such as olive, canola, soybean, or sunflower oil. Meal planning  Eat a balanced diet that includes: ? 5 or more servings of fruits and vegetables each day. At each meal, try to fill half of your plate with fruits and vegetables. ? Up to 6-8 servings of whole grains each day. ? Less than 6 oz of lean meat, poultry, or fish each day. A 3-oz serving of meat is about the same size as a deck of cards. One egg equals 1 oz. ? 2 servings of low-fat dairy each day. ? A serving of nuts, seeds, or beans 5 times each week. ? Heart-healthy fats. Healthy fats called Omega-3 fatty acids are found in foods such as flaxseeds and coldwater fish, like sardines, salmon, and mackerel.  Limit how much you eat of the following: ? Canned or prepackaged foods. ? Food that is high in trans fat, such as fried foods. ? Food that is high in saturated fat, such as fatty meat. ? Sweets, desserts, sugary drinks, and other foods with added sugar. ? Full-fat dairy products.  Do not salt foods before eating.  Try to eat at least 2 vegetarian meals each week.  Eat more home-cooked food and less restaurant, buffet, and fast food.  When eating at a restaurant, ask that your food  be prepared with less salt or no salt, if possible. What foods are recommended? The items listed may not be a complete list. Talk with your dietitian about what dietary choices are best for you. Grains Whole-grain or whole-wheat bread. Whole-grain or whole-wheat pasta. Brown rice. Modena Morrow. Bulgur. Whole-grain and low-sodium cereals. Pita bread. Low-fat, low-sodium crackers. Whole-wheat flour tortillas. Vegetables Fresh or frozen vegetables (raw, steamed, roasted, or grilled). Low-sodium or reduced-sodium tomato and vegetable juice. Low-sodium or reduced-sodium tomato sauce and  tomato paste. Low-sodium or reduced-sodium canned vegetables. Fruits All fresh, dried, or frozen fruit. Canned fruit in natural juice (without added sugar). Meat and other protein foods Skinless chicken or [place]. Ground chicken or [place]. Pork with fat trimmed off. Fish and seafood. Egg whites. Dried beans, peas, or lentils. Unsalted nuts, nut butters, and seeds. Unsalted canned beans. Lean cuts of beef with fat trimmed off. Low-sodium, lean deli meat. Dairy Low-fat (1%) or fat-free (skim) milk. Fat-free, low-fat, or reduced-fat cheeses. Nonfat, low-sodium ricotta or cottage cheese. Low-fat or nonfat yogurt. Low-fat, low-sodium cheese. Fats and oils Soft margarine without trans fats. Vegetable oil. Low-fat, reduced-fat, or light mayonnaise and salad dressings (reduced-sodium). Canola, safflower, olive, soybean, and sunflower oils. Avocado. Seasoning and other foods Herbs. Spices. Seasoning mixes without salt. Unsalted popcorn and pretzels. Fat-free sweets. What foods are not recommended? The items listed may not be a complete list. Talk with your dietitian about what dietary choices are best for you. Grains Baked goods made with fat, such as croissants, muffins, or some breads. Dry pasta or rice meal packs. Vegetables Creamed or fried vegetables. Vegetables in a cheese sauce. Regular canned vegetables (not low-sodium or reduced-sodium). Regular canned tomato sauce and paste (not low-sodium or reduced-sodium). Regular tomato and vegetable juice (not low-sodium or reduced-sodium). Angie Fava. Olives. Fruits Canned fruit in a light or heavy syrup. Fried fruit. Fruit in cream or butter sauce. Meat and other protein foods Fatty cuts of meat. Ribs. Fried meat. Berniece Salines. Sausage. Bologna and other processed lunch meats. Salami. Fatback. Hotdogs. Bratwurst. Salted nuts and seeds. Canned beans with added salt. Canned or smoked fish. Whole eggs or egg yolks. Chicken or [place] with skin. Dairy Whole or 2%  milk, cream, and half-and-half. Whole or full-fat cream cheese. Whole-fat or sweetened yogurt. Full-fat cheese. Nondairy creamers. Whipped toppings. Processed cheese and cheese spreads. Fats and oils Butter. Stick margarine. Lard. Shortening. Ghee. Bacon fat. Tropical oils, such as coconut, palm kernel, or palm oil. Seasoning and other foods Salted popcorn and pretzels. Onion salt, garlic salt, seasoned salt, table salt, and sea salt. Worcestershire sauce. Tartar sauce. Barbecue sauce. Teriyaki sauce. Soy sauce, including reduced-sodium. Steak sauce. Canned and packaged gravies. Fish sauce. Oyster sauce. Cocktail sauce. Horseradish that you find on the shelf. Ketchup. Mustard. Meat flavorings and tenderizers. Bouillon cubes. Hot sauce and Tabasco sauce. Premade or packaged marinades. Premade or packaged taco seasonings. Relishes. Regular salad dressings. Where to find more information:  National Heart, Lung, and [place]: https://wilson-eaton.com/  American Heart Association: www.heart.org Summary  The DASH eating plan is a healthy eating plan that has been shown to reduce high blood pressure (hypertension). It may also reduce your risk for type 2 diabetes, heart disease, and stroke.  With the DASH eating plan, you should limit salt (sodium) intake to 2,300 mg a day. If you have hypertension, you may need to reduce your sodium intake to 1,500 mg a day.  When on the DASH eating plan, aim to eat more fresh fruits and vegetables, whole  grains, lean proteins, low-fat dairy, and heart-healthy fats.  Work with your health care provider or diet and nutrition specialist (dietitian) to adjust your eating plan to your individual calorie needs. This information is not intended to replace advice given to you by your health care provider. Make sure you discuss any questions you have with your health care provider. Document Revised: 03/04/2017 Document Reviewed: 03/15/2016 Elsevier Patient Education  2020  [employer].

## 2019-10-23 IMAGING — DX DG CHEST 2V
2 series · 2 of 2 positions shown · non-contrast
Comparison: 05/15/2009

CLINICAL DATA: Chest pain

EXAM:
CHEST - 2 VIEW

[chest pa]
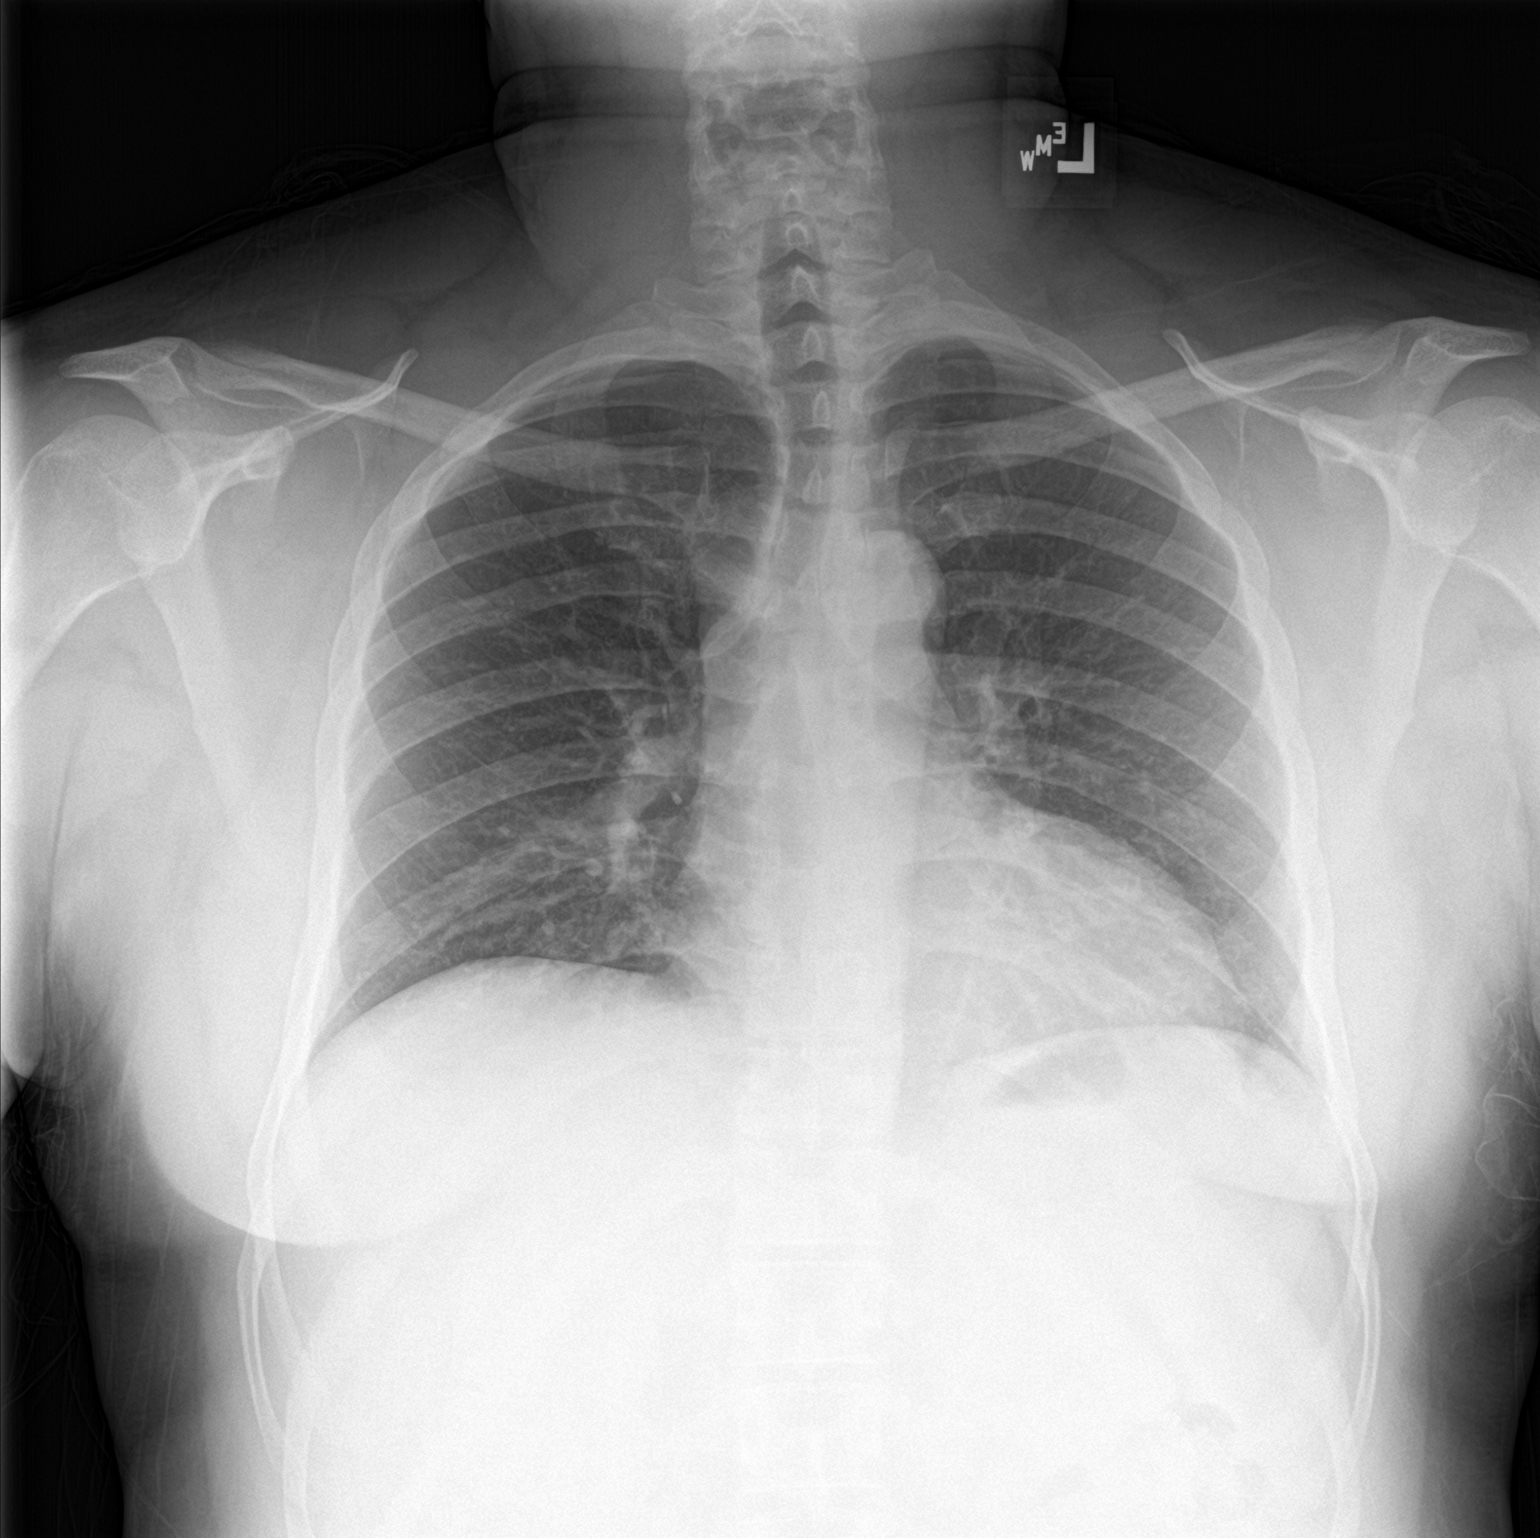

[chest lat]
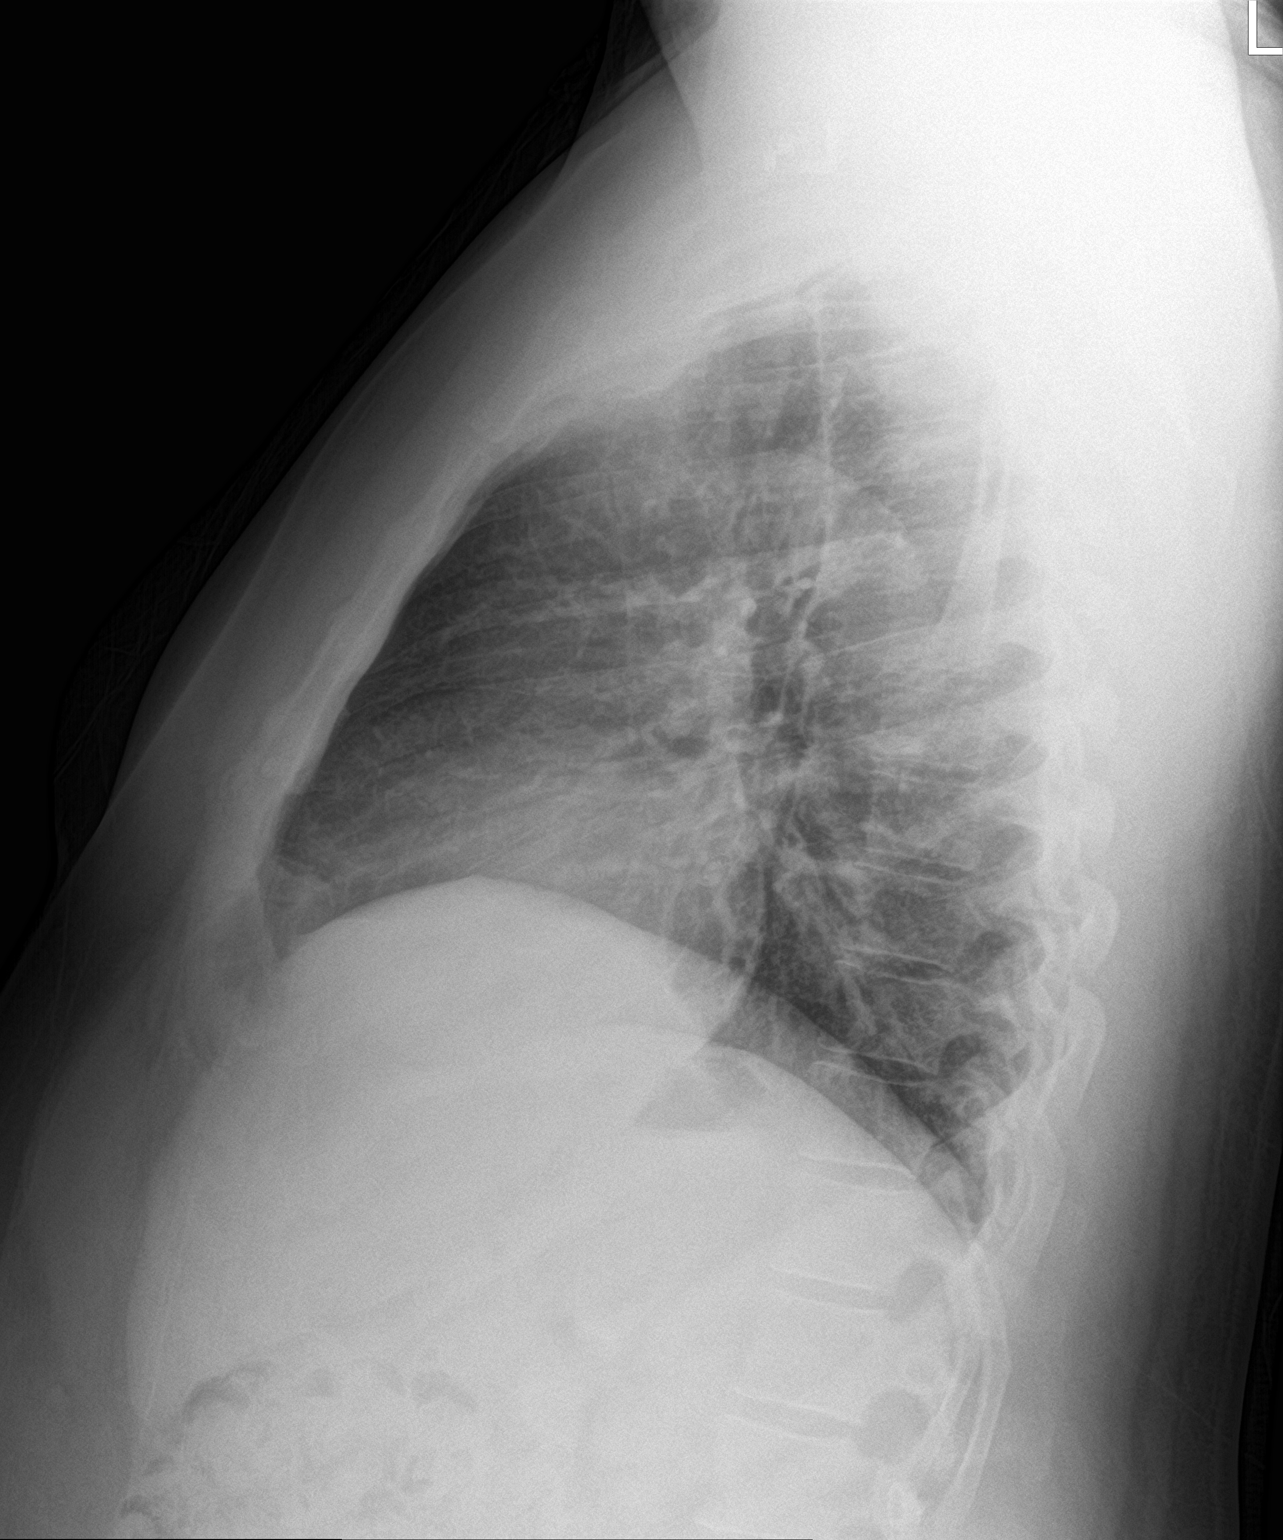

[2 of 2 positions shown; findings below may reference images not displayed]

FINDINGS: The heart size and mediastinal contours are within normal limits.
Both lungs are clear. The visualized skeletal structures are
unremarkable.
IMPRESSION: No active cardiopulmonary disease.

## 2020-01-21 NOTE — Telephone Encounter (Signed)
LVM to schedule sleep study ?

## 2020-01-22 ENCOUNTER — Encounter (HOSPITAL_BASED_OUTPATIENT_CLINIC_OR_DEPARTMENT_OTHER): Payer: Self-pay | Admitting: Emergency Medicine

## 2020-01-22 ENCOUNTER — Other Ambulatory Visit: Payer: Self-pay

## 2020-01-22 ENCOUNTER — Emergency Department (HOSPITAL_BASED_OUTPATIENT_CLINIC_OR_DEPARTMENT_OTHER)
Admission: EM | Admit: 2020-01-22 | Discharge: 2020-01-22 | Disposition: A | Discharge status: Home / Self Care | Payer: 59 | Attending: Emergency Medicine | Admitting: Emergency Medicine

## 2020-01-22 DIAGNOSIS — E1165 Type 2 diabetes mellitus with hyperglycemia: Secondary | ICD-10-CM | POA: Diagnosis not present

## 2020-01-22 DIAGNOSIS — Z79899 Other long term (current) drug therapy: Secondary | ICD-10-CM | POA: Insufficient documentation

## 2020-01-22 DIAGNOSIS — R739 Hyperglycemia, unspecified: Secondary | ICD-10-CM | POA: Diagnosis present

## 2020-01-22 DIAGNOSIS — E1169 Type 2 diabetes mellitus with other specified complication: Secondary | ICD-10-CM

## 2020-01-22 DIAGNOSIS — I10 Essential (primary) hypertension: Secondary | ICD-10-CM | POA: Insufficient documentation

## 2020-01-22 DIAGNOSIS — T7840XA Allergy, unspecified, initial encounter: Secondary | ICD-10-CM

## 2020-01-22 LAB — BASIC METABOLIC PANEL
Anion gap: 10 (ref 5–15)
BUN: 14 mg/dL (ref 6–20)
CO2: 24 mmol/L (ref 22–32)
Calcium: 8.9 mg/dL (ref 8.9–10.3)
Chloride: 95 mmol/L — ABNORMAL LOW (ref 98–111)
Creatinine, Ser: 1.28 mg/dL — ABNORMAL HIGH (ref 0.61–1.24)
GFR, Estimated: >60 mL/min (ref >60)
Glucose, Bld: 624 mg/dL (ref 70–99)
Potassium: 3.4 mmol/L — ABNORMAL LOW (ref 3.5–5.1)
Sodium: 129 mmol/L — ABNORMAL LOW (ref 135–145)

## 2020-01-22 LAB — CBC WITH DIFFERENTIAL/PLATELET
Abs Immature Granulocytes: 0.03 10*3/uL (ref 0.00–0.07)
Basophils Absolute: 0.1 10*3/uL (ref 0.0–0.1)
Basophils Relative: 1 %
Eosinophils Absolute: 0.1 10*3/uL (ref 0.0–0.5)
Eosinophils Relative: 2 %
HCT: 40.4 % (ref 39.0–52.0)
Hemoglobin: 14 g/dL (ref 13.0–17.0)
Immature Granulocytes: 0 %
Lymphocytes Relative: 25 %
Lymphs Abs: 2 10*3/uL (ref 0.7–4.0)
MCH: 28.5 pg (ref 26.0–34.0)
MCHC: 34.7 g/dL (ref 30.0–36.0)
MCV: 82.1 fL (ref 80.0–100.0)
Monocytes Absolute: 0.6 10*3/uL (ref 0.1–1.0)
Monocytes Relative: 8 %
Neutro Abs: 5.3 10*3/uL (ref 1.7–7.7)
Neutrophils Relative %: 64 %
Platelets: 271 10*3/uL (ref 150–400)
RBC: 4.92 MIL/uL (ref 4.22–5.81)
RDW: 12.7 % (ref 11.5–15.5)
WBC: 8.1 10*3/uL (ref 4.0–10.5)
nRBC: 0 % (ref 0.0–0.2)

## 2020-01-22 LAB — CBG MONITORING, ED
Glucose-Capillary: >600 mg/dL (ref 70–99)
Glucose-Capillary: 447 mg/dL — ABNORMAL HIGH (ref 70–99)
Glucose-Capillary: 459 mg/dL — ABNORMAL HIGH (ref 70–99)
Glucose-Capillary: 316 mg/dL — ABNORMAL HIGH (ref 70–99)

## 2020-01-22 MED ORDER — METFORMIN HCL 500 MG PO TABS
500.0000 mg | ORAL_TABLET | Freq: Once | ORAL | Status: AC
  Administered 2020-01-22: 500 mg via ORAL
  Filled 2020-01-22: qty 1

## 2020-01-22 MED ORDER — METFORMIN HCL 500 MG PO TABS
500.0000 mg | ORAL_TABLET | Freq: Two times a day (BID) | ORAL | 0 refills | Status: DC
Start: 2020-01-22 — End: 2020-01-22

## 2020-01-22 MED ORDER — INSULIN ASPART 100 UNIT/ML ~~LOC~~ SOLN
SUBCUTANEOUS | Status: AC
  Administered 2020-01-22: 3 [IU] via SUBCUTANEOUS
  Filled 2020-01-22: qty 3

## 2020-01-22 MED ORDER — FAMOTIDINE 20 MG PO TABS
20.0000 mg | ORAL_TABLET | Freq: Once | ORAL | Status: AC
  Administered 2020-01-22: 20 mg via ORAL
  Filled 2020-01-22: qty 1

## 2020-01-22 MED ORDER — DIPHENHYDRAMINE HCL 25 MG PO CAPS
25.0000 mg | ORAL_CAPSULE | Freq: Once | ORAL | Status: AC
  Administered 2020-01-22: 25 mg via ORAL
  Filled 2020-01-22: qty 1

## 2020-01-22 MED ORDER — SODIUM CHLORIDE 0.9 % IV BOLUS
1000.0000 mL | Freq: Once | INTRAVENOUS | Status: AC
  Administered 2020-01-22: 1000 mL via INTRAVENOUS

## 2020-01-22 MED ORDER — FAMOTIDINE 20 MG PO TABS
20.0000 mg | ORAL_TABLET | Freq: Two times a day (BID) | ORAL | 0 refills | Status: DC
Start: 2020-01-22 — End: 2022-01-19

## 2020-01-22 MED ORDER — LORATADINE 10 MG PO TABS
10.0000 mg | ORAL_TABLET | Freq: Once | ORAL | Status: AC
  Administered 2020-01-22: 10 mg via ORAL
  Filled 2020-01-22: qty 1

## 2020-01-22 MED ORDER — EPINEPHRINE 0.3 MG/0.3ML IJ SOAJ: 0.3000 mg | Freq: Once | INTRAMUSCULAR | Status: DC

## 2020-01-22 MED ORDER — INSULIN ASPART 100 UNIT/ML IV SOLN
3.0000 [IU] | Freq: Once | INTRAVENOUS | Status: AC
  Filled 2020-01-22: qty 0.03

## 2020-01-22 MED ORDER — SODIUM CHLORIDE 0.9 % IV BOLUS
500.0000 mL | Freq: Once | INTRAVENOUS | Status: AC
  Administered 2020-01-22: 500 mL via INTRAVENOUS

## 2020-01-22 MED ORDER — INSULIN ASPART 100 UNIT/ML IV SOLN
6.0000 [IU] | Freq: Once | INTRAVENOUS | Status: AC
  Filled 2020-01-22: qty 0.06

## 2020-01-22 MED ORDER — INSULIN ASPART 100 UNIT/ML ~~LOC~~ SOLN
SUBCUTANEOUS | Status: AC
  Administered 2020-01-22: 6 [IU] via SUBCUTANEOUS
  Filled 2020-01-22: qty 6

## 2020-01-22 MED ORDER — EPINEPHRINE 0.3 MG/0.3ML IJ SOAJ
0.3000 mg | INTRAMUSCULAR | 0 refills | Status: DC | PRN
Start: 2020-01-22 — End: 2022-01-19

## 2020-01-22 MED ORDER — METFORMIN HCL 850 MG PO TABS
850.0000 mg | ORAL_TABLET | Freq: Two times a day (BID) | ORAL | 0 refills | Status: DC
Start: 2020-01-22 — End: 2020-02-19

## 2020-01-22 NOTE — ED Notes (Signed)
Review D/C papers with pt, reviewed Rx with pt, pt states understanding, pt denies questions at this time. 

## 2020-01-22 NOTE — ED Triage Notes (Addendum)
Pt c/o lips swelling since yesterday. Denies any difficulty breathing. Pt also states he wants to be check for diabetes due to excessive thirst and frequent urination. Pt reports drinking cranberry juice when it started.

## 2020-01-22 NOTE — ED Provider Notes (Addendum)
[facility] EMERGENCY DEPARTMENT Provider Note   CSN: [id number] Arrival date & time: 01/22/20  0231     History Chief Complaint  Patient presents with  . Oral Swelling    Chris Harrington is a 44 y.o. male.   Hyperglycemia Severity:  Severe Onset quality:  Gradual Timing:  Constant Progression:  Worsening Chronicity:  New Diabetes status:  Non-diabetic Current diabetic therapy:  None Context comment:  Drinking cranberry juice that cause upper lip to swell, then drank it again and it got worse.  Along with gatorade  Relieved by:  Nothing Ineffective treatments:  None tried Associated symptoms: increased thirst and polyuria   Associated symptoms: no chest pain, no dizziness, no fever, no shortness of breath and no vomiting   Risk factors: family hx of diabetes   Patient presented for lip swelling following drinking cranberry juice for the last 2 days.Marland Kitchen  He thought that would make the urination better.  Instead it became worse as did his thirst and the lip swelling also worsened with repeat drinking of juice.  He has also been drinking copious gatorade.  Patient reports polyuria and polydipsia for approximately one month duration.      Past Medical History:  Diagnosis Date  . Hypertension   . Obesity     Patient Active Problem List   Diagnosis Date Noted  . Hypertension   . Obesity   . Elevated BP 02/15/2013  . Cough 02/15/2013    Past Surgical History:  Procedure Laterality Date  . TONSILECTOMY, ADENOIDECTOMY, BILATERAL MYRINGOTOMY AND TUBES     pt has not had tubes in ears       Family History  Problem Relation Age of Onset  . Hypertension Mother   . Diabetes Mother   . Heart disease Mother   . Stroke Mother   . Hypertension Father   . Cancer Paternal Grandmother        passed from unknown cancer  . Hypertension Maternal Grandmother   . Hypertension Maternal Grandfather     Social History   Tobacco Use  . Smoking status: Never Smoker  .  Smokeless tobacco: Never Used  Substance Use Topics  . Alcohol use: Yes    Comment: occasional  . Drug use: No    Home Medications Prior to Admission medications   Medication Sig Start Date End Date Taking? Authorizing Provider  amLODipine (NORVASC) 10 MG tablet Take 1 tablet (10 mg total) by mouth daily. 09/27/19   Philip Aspen, Limmie Patricia, MD  famotidine (PEPCID) 20 MG tablet Take 1 tablet (20 mg total) by mouth 2 (two) times daily. 01/22/20   Jadalyn Oliveri, MD  metFORMIN (GLUCOPHAGE) 500 MG tablet Take 1 tablet (500 mg total) by mouth 2 (two) times daily with a meal. 01/22/20   Londin Antone, MD  potassium chloride SA (KLOR-CON) 20 MEQ tablet Take 1 tablet (20 mEq total) by mouth daily for 21 days. 03/25/19 07/16/19  Terald Sleeper, MD    Allergies    Patient has no known allergies.  Review of Systems   Review of Systems  Constitutional: Negative for fever.  HENT: Negative for congestion.   Respiratory: Negative for shortness of breath.   Cardiovascular: Negative for chest pain.  Gastrointestinal: Negative for vomiting.  Endocrine: Positive for polydipsia and polyuria.  Genitourinary: Negative for difficulty urinating.  Musculoskeletal: Negative for arthralgias.  Skin: Negative for rash.  Neurological: Negative for dizziness.  Psychiatric/Behavioral: Negative for agitation.  All other systems reviewed and are negative.  Physical Exam Updated Vital Signs BP (!) 152/101   Pulse 87   Temp 98.3 F (36.8 C) (Oral)   Resp 18   Ht 5\' 2"  (1.575 m)   Wt 90.7 kg   SpO2 99%   BMI 36.58 kg/m   Physical Exam Vitals and nursing note reviewed.  Constitutional:      General: He is not in acute distress.    Appearance: Normal appearance.  HENT:     Head: Normocephalic and atraumatic.     Nose: Nose normal.     Mouth/Throat:     Mouth: Mucous membranes are moist.     Pharynx: Oropharynx is clear.     Comments: No swelling of the mouth or uvula.  Dime sized area of  swelling of the right upper lip Eyes:     Conjunctiva/sclera: Conjunctivae normal.     Pupils: Pupils are equal, round, and reactive to light.  Cardiovascular:     Rate and Rhythm: Normal rate and regular rhythm.     Pulses: Normal pulses.     Heart sounds: Normal heart sounds.  Pulmonary:     Effort: Pulmonary effort is normal.     Breath sounds: Normal breath sounds.  Abdominal:     General: Abdomen is flat. Bowel sounds are normal.     Palpations: Abdomen is soft.     Tenderness: There is no abdominal tenderness. There is no guarding.  Musculoskeletal:        General: Normal range of motion.     Cervical back: Normal range of motion and neck supple.  Skin:    General: Skin is warm.     Capillary Refill: Capillary refill takes less than 2 seconds.  Neurological:     General: No focal deficit present.     Mental Status: He is alert and oriented to person, place, and time.     Deep Tendon Reflexes: Reflexes normal.  Psychiatric:        Mood and Affect: Mood normal.        Behavior: Behavior normal.     ED Results / Procedures / Treatments   Labs (all labs ordered are listed, but only abnormal results are displayed) Results for orders placed or performed during the hospital encounter of 01/22/20  CBC with Differential/Platelet  Result Value Ref Range   WBC 8.1 4.0 - 10.5 K/uL   RBC 4.92 4.22 - 5.81 MIL/uL   Hemoglobin 14.0 13.0 - 17.0 g/dL   HCT 01/24/20 39 - 52 %   MCV 82.1 80.0 - 100.0 fL   MCH 28.5 26.0 - 34.0 pg   MCHC 34.7 30.0 - 36.0 g/dL   RDW 25.8 52.7 - 78.2 %   Platelets 271 150 - 400 K/uL   nRBC 0.0 0.0 - 0.2 %   Neutrophils Relative % 64 %   Neutro Abs 5.3 1.7 - 7.7 K/uL   Lymphocytes Relative 25 %   Lymphs Abs 2.0 0.7 - 4.0 K/uL   Monocytes Relative 8 %   Monocytes Absolute 0.6 0.1 - 1.0 K/uL   Eosinophils Relative 2 %   Eosinophils Absolute 0.1 0.0 - 0.5 K/uL   Basophils Relative 1 %   Basophils Absolute 0.1 0.0 - 0.1 K/uL   Immature Granulocytes 0 %     Abs Immature Granulocytes 0.03 0.00 - 0.07 K/uL  Basic metabolic panel  Result Value Ref Range   Sodium 129 (L) 135 - 145 mmol/L   Potassium 3.4 (L) 3.5 - 5.1 mmol/L  Chloride 95 (L) 98 - 111 mmol/L   CO2 24 22 - 32 mmol/L   Glucose, Bld 624 (HH) 70 - 99 mg/dL   BUN 14 6 - 20 mg/dL   Creatinine, Ser 3.64 (H) 0.61 - 1.24 mg/dL   Calcium 8.9 8.9 - 68.0 mg/dL   GFR, Estimated >32 >12 mL/min   Anion gap 10 5 - 15  POC CBG, ED  Result Value Ref Range   Glucose-Capillary >600 (HH) 70 - 99 mg/dL   No results found.  Radiology No results found.  Procedures Procedures (including critical care time)  Medications Ordered in ED Medications  sodium chloride 0.9 % bolus 500 mL (has no administration in time range)  metFORMIN (GLUCOPHAGE) tablet 500 mg (has no administration in time range)  sodium chloride 0.9 % bolus 1,000 mL ( Intravenous Stopped 01/22/20 0455)  loratadine (CLARITIN) tablet 10 mg (10 mg Oral Given 01/22/20 0346)  famotidine (PEPCID) tablet 20 mg (20 mg Oral Given 01/22/20 0346)  insulin aspart (novoLOG) injection 6 Units (6 Units Subcutaneous Given 01/22/20 0458)  metFORMIN (GLUCOPHAGE) tablet 500 mg (500 mg Oral Given 01/22/20 0457)  diphenhydrAMINE (BENADRYL) capsule 25 mg (25 mg Oral Given 01/22/20 0456)  insulin aspart (novoLOG) injection 3 Units (3 Units Subcutaneous Given 01/22/20 0541)    ED Course  I have reviewed the triage vital signs and the nursing notes.  Pertinent labs & imaging results that were available during my care of the patient were reviewed by me and considered in my medical decision making (see chart for details).   Lips swelling has improved in the ED.  Have advised benadryl every 6 hours at home and pepcid twice daily.  You cannot drive while taking this medication.  Patient verbalizes understanding.  Strict return precautions given.    Patient is a new diabetic, not in DKA.  No anion gap.  Hydrated with NSS in the ED and given insulin  and metformin in the ED.  Sugar came down but did not drop to what would be considered normal levels. This is acceptable in the short term as I suspect the patient has been having High sugars for sometime and if I put the sugar into normal range the patient may actually experience symptoms of hypoglycemia. I have advised the patient on a low carb diet (verbally and in printed form) and to drink only water.  No more gatorade, juice or soda or alcohol or sweets of any kind. Patient verbalizes understanding of this.    Have started metformin in the ED and sent RX to the pharmacy and have patient follow up with PMD as he will need further testing and potentially further treatment.  Cranberry has been listed as an allergy and patient told to not eat or drink cranberry in any form, this was also printed on his discharge paperwork.  Pepcid and benadryl for mild swelling.  Will not do steroids given sugar and as area is so small and steroids will cause more harm to the patient. Strict return precautions given for worsening swelling. Will prescribe epi pen in case patient comes into contact with cranberry.    Toni Hoffmeister was evaluated in Emergency Department on 01/22/2020 for the symptoms described in the history of present illness. He was evaluated in the context of the global COVID-19 pandemic, which necessitated consideration that the patient might be at risk for infection with the SARS-CoV-2 virus that causes COVID-19. Institutional protocols and algorithms that pertain to the evaluation of patients at risk  for COVID-19 are in a state of rapid change based on information released by regulatory bodies including the CDC and federal and state organizations. These policies and algorithms were followed during the patient's care in the ED.  Final Clinical Impression(s) / ED Diagnoses Final diagnoses:  Type 2 diabetes mellitus with other specified complication, without long-term current use of insulin (HCC)   Return  for intractable cough, coughing up blood,fevers >100.4 unrelieved by medication, shortness of breath, intractable vomiting, chest pain, shortness of breath, weakness,numbness, changes in speech, facial asymmetry,abdominal pain, passing out,Inability to tolerate liquids or food, cough, altered mental status or any concerns. No signs of systemic illness or infection. The patient is nontoxic-appearing on exam and vital signs are within normal limits.   I have reviewed the triage vital signs and the nursing notes. Pertinent labs &imaging results that were available during my care of the patient were reviewed by me and considered in my medical decision making (see chart for details).After history, exam, and medical workup I feel the patient has beenappropriately medically screened and is safe for discharge home. Pertinent diagnoses were discussed with the patient. Patient was given return precautions.        Jonnelle Lawniczak, MD 01/22/20 [id number]    Cy BlamerPalumbo, Nazia Rhines, MD 01/22/20 [id number]

## 2020-01-22 NOTE — Discharge Instructions (Addendum)
You are allergic to cranberry to not eat or drink cranberry in any form

## 2020-01-24 ENCOUNTER — Other Ambulatory Visit: Payer: Self-pay

## 2020-01-24 ENCOUNTER — Encounter: Payer: Self-pay | Admitting: Internal Medicine

## 2020-01-24 ENCOUNTER — Ambulatory Visit (INDEPENDENT_AMBULATORY_CARE_PROVIDER_SITE_OTHER): Payer: 59 | Admitting: Internal Medicine

## 2020-01-24 VITALS — BP 130/80 | HR 87 | Temp 98.4°F | Ht 63.0 in | Wt 203.8 lb

## 2020-01-24 DIAGNOSIS — R739 Hyperglycemia, unspecified: Secondary | ICD-10-CM

## 2020-01-24 DIAGNOSIS — R220 Localized swelling, mass and lump, head: Secondary | ICD-10-CM | POA: Diagnosis not present

## 2020-01-24 DIAGNOSIS — E119 Type 2 diabetes mellitus without complications: Secondary | ICD-10-CM | POA: Diagnosis not present

## 2020-01-24 DIAGNOSIS — R221 Localized swelling, mass and lump, neck: Secondary | ICD-10-CM

## 2020-01-24 LAB — BASIC METABOLIC PANEL
BUN: 10 mg/dL (ref 7–25)
CO2: 26 mmol/L (ref 20–32)
Calcium: 8.8 mg/dL (ref 8.6–10.3)
Chloride: 100 mmol/L (ref 98–110)
Creat: 1.1 mg/dL (ref 0.60–1.35)
Glucose, Bld: 233 mg/dL — ABNORMAL HIGH (ref 65–99)
Potassium: 3.3 mmol/L — ABNORMAL LOW (ref 3.5–5.3)
Sodium: 135 mmol/L (ref 135–146)

## 2020-01-24 LAB — POCT GLYCOSYLATED HEMOGLOBIN (HGB A1C): Hemoglobin A1C: 13.1 % — AB (ref 4.0–5.6)

## 2020-01-24 MED ORDER — LANTUS SOLOSTAR 100 UNIT/ML ~~LOC~~ SOPN: 10.0000 [IU] | PEN_INJECTOR | Freq: Every day | SUBCUTANEOUS | 99 refills | Status: DC

## 2020-01-24 MED ORDER — AMOXICILLIN-POT CLAVULANATE 875-125 MG PO TABS: 1.0000 | ORAL_TABLET | Freq: Two times a day (BID) | ORAL | 0 refills | Status: AC

## 2020-01-24 NOTE — Patient Instructions (Signed)
-  Nice seeing you today!!  -Lab work today; will notify you once results are available.  -Start lantus 10 units at bedtime.  -Start augmentin 1 tablet twice daily for 10 days.  -Continue to check your blood sugars fasting in the morning.  -Schedule follow up in 1 month.

## 2020-01-24 NOTE — Progress Notes (Signed)
Established Patient Office Visit     This visit occurred during the SARS-CoV-2 public health emergency.  Safety protocols were in place, including screening questions prior to the visit, additional usage of staff PPE, and extensive cleaning of exam room while observing appropriate contact time as indicated for disinfecting solutions.    CC/Reason for Visit: ED follow-up, swelling lip and face, new onset diabetes  HPI: Chris Harrington is a 44 y.o. male who is coming in today for the above mentioned reasons. Past Medical History is significant for: Obesity and well-controlled hypertension as well as vitamin D deficiency.  2 days ago he noticed some swelling of his lip, went to the emergency department, where he was found to have a CBG of over 600 and was diagnosed with new onset diabetes.  He was given fluids, was given a prescription for Metformin.  For the swelling I do not see any imaging, he was given Benadryl and famotidine and asked to follow-up with me.  2 days later he sees me, he states his sugars are still high around 300-400 range.  More concerning the swelling has progressed from his lip to his face, chin and neck area.  He denies any difficulty breathing or swallowing.   Past Medical/Surgical History: Past Medical History:  Diagnosis Date  . Hypertension   . Obesity     Past Surgical History:  Procedure Laterality Date  . TONSILECTOMY, ADENOIDECTOMY, BILATERAL MYRINGOTOMY AND TUBES     pt has not had tubes in ears    Social History:  reports that he has never smoked. He has never used smokeless tobacco. He reports current alcohol use. He reports that he does not use drugs.  Allergies: Allergies  Allergen Reactions  . Cranberry Swelling    Family History:  Family History  Problem Relation Age of Onset  . Hypertension Mother   . Diabetes Mother   . Heart disease Mother   . Stroke Mother   . Hypertension Father   . Cancer Paternal Grandmother        passed  from unknown cancer  . Hypertension Maternal Grandmother   . Hypertension Maternal Grandfather      Current Outpatient Medications:  .  amLODipine (NORVASC) 10 MG tablet, Take 1 tablet (10 mg total) by mouth daily., Disp: 90 tablet, Rfl: 1 .  diphenhydrAMINE (BENADRYL) 50 MG capsule, Take 50 mg by mouth every 6 (six) hours as needed., Disp: , Rfl:  .  EPINEPHrine 0.3 mg/0.3 mL IJ SOAJ injection, Inject 0.3 mg into the muscle as needed for anaphylaxis., Disp: 1 each, Rfl: 0 .  famotidine (PEPCID) 20 MG tablet, Take 1 tablet (20 mg total) by mouth 2 (two) times daily., Disp: 30 tablet, Rfl: 0 .  metFORMIN (GLUCOPHAGE) 850 MG tablet, Take 1 tablet (850 mg total) by mouth 2 (two) times daily with a meal., Disp: 60 tablet, Rfl: 0 .  amoxicillin-clavulanate (AUGMENTIN) 875-125 MG tablet, Take 1 tablet by mouth 2 (two) times daily for 10 days., Disp: 20 tablet, Rfl: 0 .  insulin glargine (LANTUS SOLOSTAR) 100 UNIT/ML Solostar Pen, Inject 10 Units into the skin at bedtime., Disp: 15 mL, Rfl: PRN  Review of Systems:  Constitutional: Denies fever, chills, diaphoresis, appetite change and fatigue.  HEENT: Denies photophobia, eye pain, redness, hearing loss, ear pain, congestion, sore throat, rhinorrhea, sneezing, trouble swallowing, neck pain, neck stiffness and tinnitus.   Respiratory: Denies SOB, DOE, cough, chest tightness,  and wheezing.   Cardiovascular: Denies chest pain,  palpitations and leg swelling.  Gastrointestinal: Denies nausea, vomiting, abdominal pain, diarrhea, constipation, blood in stool and abdominal distention.  Genitourinary: Denies dysuria, urgency, frequency, hematuria, flank pain and difficulty urinating.  Endocrine: Denies: hot or cold intolerance, sweats, changes in hair or nails, polyuria, polydipsia. Musculoskeletal: Denies myalgias, back pain, joint swelling, arthralgias and gait problem.  Skin: Denies pallor, rash and wound.  Neurological: Denies dizziness, seizures,  syncope, weakness, light-headedness, numbness and headaches.  Hematological: Denies adenopathy. Easy bruising, personal or family bleeding history  Psychiatric/Behavioral: Denies suicidal ideation, mood changes, confusion, nervousness, sleep disturbance and agitation    Physical Exam: Vitals:   01/24/20 1301  BP: 130/80  Pulse: 87  Temp: 98.4 F (36.9 C)  TempSrc: Oral  SpO2: 97%  Weight: 203 lb 12.8 oz (92.4 kg)  Height: 5\' 3"  (1.6 m)    Body mass index is 36.1 kg/m.   Constitutional: NAD, calm, comfortable Eyes: PERRL, lids and conjunctivae normal ENMT: Mucous membranes are moist.  He has significant edema of his upper lip.  Over the right upper lip there is an area of pustulence.  The inside of his lower lip is also edematous.  There is no edema or redness of the uvula.  He has another area of pustulence around his chin that is surrounded by edema and erythema. Neck: There appears to be some neck edema over the right side with significant tightness. Respiratory: clear to auscultation bilaterally, no wheezing, no crackles. Normal respiratory effort. No accessory muscle use.  Cardiovascular: Regular rate and rhythm, no murmurs / rubs / gallops. No extremity edema.  Neurologic: Grossly intact and nonfocal Psychiatric: Normal judgment and insight. Alert and oriented x 3. Normal mood.    Impression and Plan:  Diabetes mellitus, new onset (HCC)  -As suspected, uncontrolled, A1c in office today is 13.1.  Of note his A1c was 5.9 in March. -Advised to continue Metformin as prescribed by the ED, will also start on Lantus 10 units at bedtime. -He will be referred to diabetes education and endocrinology. -He will continue fasting CBG measurements and I will see him in 4 weeks if he cannot be seen sooner by endocrine.   Swelling of face Neck swelling -I am concerned for an infectious source given the areas of pustulence as well as significant edema. -Thankfully no shortness of  breath or difficulty swallowing. -I will go ahead and start him on Augmentin for 10 days, I will send him for an urgent maxillofacial and soft tissue neck CT scan. -Renal function will be ordered STAT to make sure that he can tolerate contrast load.  His creatinine was a little elevated in the ED 2 days ago at 1.28 but he did receive IV fluids and has been drinking much more water at home since that diabetes diagnosis.    Patient Instructions  -Nice seeing you today!!  -Lab work today; will notify you once results are available.  -Start lantus 10 units at bedtime.  -Start augmentin 1 tablet twice daily for 10 days.  -Continue to check your blood sugars fasting in the morning.  -Schedule follow up in 1 month.     02-23-1980, MD [facility]

## 2020-01-25 ENCOUNTER — Other Ambulatory Visit: Payer: Self-pay | Admitting: Internal Medicine

## 2020-01-25 ENCOUNTER — Ambulatory Visit: Admission: RE | Admit: 2020-01-25 | Payer: 59 | Source: Ambulatory Visit

## 2020-01-25 ENCOUNTER — Ambulatory Visit
Admission: RE | Admit: 2020-01-25 | Discharge: 2020-01-25 | Disposition: A | Discharge status: Home / Self Care | Payer: 59 | Source: Ambulatory Visit | Attending: Internal Medicine | Admitting: Internal Medicine

## 2020-01-25 DIAGNOSIS — R220 Localized swelling, mass and lump, head: Secondary | ICD-10-CM

## 2020-01-25 DIAGNOSIS — R221 Localized swelling, mass and lump, neck: Secondary | ICD-10-CM

## 2020-01-25 MED ORDER — ACCU-CHEK AVIVA DEVI: 0 refills | Status: AC

## 2020-01-25 MED ORDER — BD PEN NEEDLE MICRO U/F 32G X 6 MM MISC: 3 refills | Status: DC

## 2020-01-30 ENCOUNTER — Telehealth: Payer: Self-pay | Admitting: *Deleted

## 2020-01-30 DIAGNOSIS — H538 Other visual disturbances: Secondary | ICD-10-CM

## 2020-01-30 NOTE — Telephone Encounter (Signed)
Patient spoke with Nurse Triage on 01/28/2020. Patient reported he is newly diagnosed diabetic. Blood sugar 154 this am. Vision has been blurry and today vision is more blurry. Blood sugar 150-160 the past few days. Patient was advised to go to ED.   Clinic RN Does not see where he followed through. Please advise

## 2020-01-30 NOTE — Telephone Encounter (Signed)
These DM numbers do not concern me. They are improved. Fleet Contras, I am very concerned about his CT scan. I have not yet seen it. Can we follow up on that? If facial edema has progressed could affect vision.

## 2020-01-30 NOTE — Telephone Encounter (Signed)
Left message on machine for patient to return our call 

## 2020-01-31 ENCOUNTER — Encounter: Payer: Self-pay | Admitting: Internal Medicine

## 2020-01-31 NOTE — Telephone Encounter (Signed)
Left message on machine for patient to return our call 

## 2020-02-01 MED ORDER — ACCU-CHEK SOFTCLIX LANCETS MISC: 12 refills | Status: AC

## 2020-02-01 MED ORDER — ACCU-CHEK SOFT TOUCH LANCETS MISC: 12 refills | Status: DC

## 2020-02-01 NOTE — Telephone Encounter (Signed)
Spoke with patient and he now has "blurred vision".  Patient should go to the ED per Dr Ardyth Harps.  Patient declines to go to the ED, but will go to an opthalmologist and have the CT's done as ordered.  Patient also request refills of lancets and test strips.  Referral and refills done.

## 2020-02-01 NOTE — Addendum Note (Signed)
Addended by: Kern Reap B on: 02/01/2020 12:57 PM   Modules accepted: Orders

## 2020-02-19 ENCOUNTER — Other Ambulatory Visit: Payer: Self-pay

## 2020-02-19 ENCOUNTER — Ambulatory Visit: Payer: 59 | Admitting: Internal Medicine

## 2020-02-19 ENCOUNTER — Encounter: Payer: Self-pay | Admitting: Internal Medicine

## 2020-02-19 VITALS — BP 150/100 | HR 74 | Temp 98.3°F | Resp 12 | Ht 63.0 in | Wt 199.6 lb

## 2020-02-19 DIAGNOSIS — R220 Localized swelling, mass and lump, head: Secondary | ICD-10-CM

## 2020-02-19 DIAGNOSIS — E119 Type 2 diabetes mellitus without complications: Secondary | ICD-10-CM | POA: Diagnosis not present

## 2020-02-19 DIAGNOSIS — E876 Hypokalemia: Secondary | ICD-10-CM | POA: Diagnosis not present

## 2020-02-19 DIAGNOSIS — R221 Localized swelling, mass and lump, neck: Secondary | ICD-10-CM

## 2020-02-19 MED ORDER — DEXAMETHASONE 1 MG PO TABS: 1.0000 mg | ORAL_TABLET | Freq: Once | ORAL | 0 refills | Status: AC

## 2020-02-19 MED ORDER — TRULICITY 0.75 MG/0.5ML ~~LOC~~ SOAJ: 0.7500 mg | SUBCUTANEOUS | 4 refills | Status: DC

## 2020-02-19 NOTE — Patient Instructions (Addendum)
-   STOP Metformin - Start Trulicity 0.75 mg weekly  - Continue Lantus 10 units daily    Let me know if your sugars are less then 70 or more then 180 mg/dL   HOW TO TREAT LOW BLOOD SUGARS (Blood sugar LESS THAN 70 MG/DL)  Please follow the RULE OF 15 for the treatment of hypoglycemia treatment (when your (blood sugars are less than 70 mg/dL)    STEP 1: Take 15 grams of carbohydrates when your blood sugar is low, which includes:   3-4 GLUCOSE TABS  OR  3-4 OZ OF JUICE OR REGULAR SODA OR  ONE TUBE OF GLUCOSE GEL     STEP 2: RECHECK blood sugar in 15 MINUTES STEP 3: If your blood sugar is still low at the 15 minute recheck --> then, go back to STEP 1 and treat AGAIN with another 15 grams of carbohydrates.    Instructions for Dexamethasone Suppression Test   Step 1: Choose a morning when you can come to our lab at 8:00 am for a blood draw.   Step 2: On the night before the blood draw, take one 1 mg tablet of dexamethasone at 11:30 pm.  The timing is VERY important!   Step 3: The next morning, go to the lab for blood work at 8:00 am.  Chris Quin do not have to be on an empty stomach, but the timing is VERY important!

## 2020-02-19 NOTE — Progress Notes (Signed)
Name: Chris Harrington  MRN/ DOB: [id number], 03/14/1976   Age/ Sex: 44 y.o., male    PCP: Philip Aspen, Limmie Patricia, MD   Reason for Endocrinology Evaluation: Type 2 Diabetes Mellitus     Date of Initial Endocrinology Visit: 02/19/2020     PATIENT IDENTIFIER: Chris Harrington is a 44 y.o. male with a past medical history of DM and HTN . The patient presented for initial endocrinology clinic visit on 02/19/2020 for consultative assistance with his diabetes management.    HPI: Mr. Bischof was    Diagnosed with DM 01/2020. Pt was hospitalized with a serum glucose of 624 mg/dL with no DKA.  He was discharged on Metformin  And Lantus was added later. 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 Of note, the pt on Amlodipine for HTN.      HOME DIABETES REGIMEN: Metformin 500 mg , 1-2 times day  Lantus 10 units daily    Statin: no ACE-I/ARB: no Prior Diabetic Education: No    GLUCOSE LOG:  80-120 mg/dL       PAST HISTORY: Past Medical History:  Past Medical History:  Diagnosis Date  . Hypertension   . Obesity    Past Surgical History:  Past Surgical History:  Procedure Laterality Date  . TONSILECTOMY, ADENOIDECTOMY, BILATERAL MYRINGOTOMY AND TUBES     pt has not had tubes in ears      Social History:  reports that he has never smoked. He has never used smokeless tobacco. He reports current alcohol use. He reports that he does not use drugs. Family History:  Family History  Problem Relation Age of Onset  . Hypertension Mother   . Diabetes Mother   .  Heart disease Mother   . Stroke Mother   . Hypertension Father   . Cancer Paternal Grandmother        passed from unknown cancer  . Hypertension Maternal Grandmother   . Hypertension Maternal Grandfather      HOME MEDICATIONS: Allergies as of 02/19/2020      Reactions   Cranberry Swelling      Medication List       Accurate as of February 19, 2020  1:16 PM. If you have any questions, ask your nurse or doctor.        Accu-Chek Aviva device Use as instructed   Accu-Chek Softclix Lancets lancets Use once daily for glucose control with Accu Chek Aviva. DxE11.9.   accu-chek soft touch lancets Use once daily for glucose control with Accu Check Aviva  DxE11.9.   amLODipine 10 MG tablet Commonly known as: NORVASC Take 1 tablet (10 mg total) by mouth daily.   BD Pen Needle Micro U/F 32G X 6 MM Misc Generic drug: Insulin Pen Needle Use once daily for glucose control Dx E11.9   diphenhydrAMINE 50 MG capsule Commonly known as: BENADRYL Take 50 mg by mouth every 6 (six) hours as needed.   EPINEPHrine 0.3 mg/0.3 mL Soaj injection Commonly known as: EPI-PEN Inject 0.3 mg into the muscle  as needed for anaphylaxis.   famotidine 20 MG tablet Commonly known as: PEPCID Take 1 tablet (20 mg total) by mouth 2 (two) times daily.   Lantus SoloStar 100 UNIT/ML Solostar Pen Generic drug: insulin glargine Inject 10 Units into the skin at bedtime.   metFORMIN 850 MG tablet Commonly known as: GLUCOPHAGE Take 1 tablet (850 mg total) by mouth 2 (two) times daily with a meal.        ALLERGIES: Allergies  Allergen Reactions  . Cranberry Swelling     REVIEW OF SYSTEMS: A comprehensive ROS was conducted with the patient and is negative except as per HPI and below:  Review of Systems  Gastrointestinal: Negative for diarrhea and nausea.  Genitourinary: Negative for frequency.  Endo/Heme/Allergies: Negative for polydipsia.      OBJECTIVE:   VITAL SIGNS: BP (!) 150/100 (BP  Location: Left Arm, Cuff Size: Large)   Pulse 74   Temp 98.3 F (36.8 C) (Oral)   Resp 12   Ht 5\' 3"  (1.6 m)   Wt 199 lb 9.6 oz (90.5 kg)   SpO2 97%   BMI 35.36 kg/m    PHYSICAL EXAM:  General: Pt appears well and is in NAD  Neck: General: Supple without adenopathy or carotid bruits. Thyroid: Thyroid size normal.  No goiter or nodules appreciated.  Lungs: Clear with good BS bilat with no rales, rhonchi, or wheezes  Heart: RRR with normal S1 and S2 and no gallops; no murmurs; no rub  Abdomen: Normoactive bowel sounds, soft, nontender, without masses or organomegaly palpable  Extremities:  Lower extremities - No pretibial edema. No lesions.  Skin: Normal texture and temperature to palpation. No rash   Neuro: MS is good with appropriate affect, pt is alert and Ox3    DM foot exam: 02/19/2020  The skin of the feet is intact without sores or ulcerations. The pedal pulses are 2+ on right and 2+ on left. The sensation is intact to a screening 5.07, 10 gram monofilament bilaterally   DATA REVIEWED:  Lab Results  Component Value Date   HGBA1C 13.1 (A) 01/24/2020   HGBA1C 5.9 (H) 07/02/2019   Lab Results  Component Value Date   LDLCALC 122 (H) 07/02/2019   CREATININE 1.10 01/24/2020     Lab Results  Component Value Date   CHOL 173 07/02/2019   HDL 32 (L) 07/02/2019   LDLCALC 122 (H) 07/02/2019   TRIG 101 07/02/2019   CHOLHDL 5 02/15/2013       Results for MARSHA, HILLMAN (MRN Gelene Mink) as of 02/20/2020 14:06  Ref. Range 02/19/2020 13:47  Potassium Latest Ref Range: 3.5 - 5.1 mEq/L 3.6   ASSESSMENT / PLAN / RECOMMENDATIONS:   1) Type 2 Diabetes Mellitus, Newly controlled, Without  complications - Most recent A1c of 13.1 %. Goal A1c < 7.0 %.    Plan: GENERAL: I have discussed with the patient the pathophysiology of diabetes. We went over the natural progression of the disease. We talked about both insulin resistance and insulin deficiency. We stressed the importance  of lifestyle changes including diet and exercise. I explained the complications associated with diabetes including retinopathy, nephropathy, neuropathy as well as increased risk of cardiovascular disease. We went over the benefit seen with glycemic control.    I explained to the patient that diabetic patients are at higher than normal risk for amputations.   Pt has made drastic dietary changes since his recent hospitalization, he used to drink 2 Liter a day of sodas in  additional to sweet teat etc but this has been changed  He is having difficulty taking metformin due to swallowing issues due to large size. We discussed GLP-1 agonists. Cautioned against GI side effects  MEDICATIONS:  STOP Metformin - Start Trulicity 0.75 mg weekly  - Continue Lantus 10 units daily    EDUCATION / INSTRUCTIONS:  BG monitoring instructions: Patient is instructed to check his blood sugars 1 times a day, fasting   Call [place] [facility] if: BG persistently < 70 or > 180 . I reviewed the Rule of 15 for the treatment of hypoglycemia in detail with the patient. Literature supplied.   2) Diabetic complications:   Eye: Does not have known diabetic retinopathy.   Neuro/ Feet: Does not have known diabetic peripheral neuropathy.  Renal: Patient does not have known baseline CKD. He is not on an ACEI/ARB at present.  3) Dyslipidemia: Discussed cardiovascular benefits of statins . LDL above goal at 122 mg/dL in 10/348 - Will discussed this further on next visit    4) Hypokalemia:  GIven chronic hx of hypokalemia and HTN, will proceed with screening for cushing syndrome and hyperaldosteronemia.      F/U in 3 months     Signed electronically by: Lyndle Herrlich, MD  [facility]  [facility] [address] [place] [place], [place] [address] Phone: [phone] FAX: [phone]   CC: Philip Aspen, Limmie Patricia, MD [address] [place]  [address] Phone: [phone]  Fax: [phone]    Return to Endocrinology clinic as below: Future Appointments  Date Time Provider Department Center  02/25/2020  4:00 PM Short, Cristine Polio, RD [facility]

## 2020-02-20 ENCOUNTER — Encounter: Payer: Self-pay | Admitting: Internal Medicine

## 2020-02-20 LAB — POTASSIUM: Potassium: 3.6 mEq/L (ref 3.5–5.1)

## 2020-02-22 LAB — ALDOSTERONE + RENIN ACTIVITY W/ RATIO
ALDOS/RENIN RATIO: 13.3 (ref 0.0–30.0)
ALDOSTERONE: 7.9 ng/dL (ref 0.0–30.0)
Renin: 0.593 ng/mL/hr (ref 0.167–5.380)

## 2020-02-25 ENCOUNTER — Ambulatory Visit: Payer: 59 | Admitting: Dietician

## 2020-03-04 ENCOUNTER — Other Ambulatory Visit: Payer: Self-pay

## 2020-03-04 ENCOUNTER — Other Ambulatory Visit (INDEPENDENT_AMBULATORY_CARE_PROVIDER_SITE_OTHER): Payer: 59

## 2020-03-04 DIAGNOSIS — E876 Hypokalemia: Secondary | ICD-10-CM | POA: Diagnosis not present

## 2020-03-04 LAB — CORTISOL: Cortisol, Plasma: 0.6 ug/dL

## 2020-05-16 ENCOUNTER — Other Ambulatory Visit: Payer: Self-pay | Admitting: Internal Medicine

## 2020-05-16 DIAGNOSIS — I10 Essential (primary) hypertension: Secondary | ICD-10-CM

## 2020-05-20 ENCOUNTER — Ambulatory Visit (INDEPENDENT_AMBULATORY_CARE_PROVIDER_SITE_OTHER): Payer: 59 | Admitting: Internal Medicine

## 2020-05-20 ENCOUNTER — Other Ambulatory Visit: Payer: Self-pay

## 2020-05-20 ENCOUNTER — Encounter: Payer: Self-pay | Admitting: Internal Medicine

## 2020-05-20 VITALS — BP 148/82 | HR 76 | Ht 63.0 in | Wt 193.1 lb

## 2020-05-20 DIAGNOSIS — E1165 Type 2 diabetes mellitus with hyperglycemia: Secondary | ICD-10-CM

## 2020-05-20 DIAGNOSIS — E785 Hyperlipidemia, unspecified: Secondary | ICD-10-CM | POA: Diagnosis not present

## 2020-05-20 DIAGNOSIS — E119 Type 2 diabetes mellitus without complications: Secondary | ICD-10-CM | POA: Diagnosis not present

## 2020-05-20 LAB — POCT GLYCOSYLATED HEMOGLOBIN (HGB A1C): Hemoglobin A1C: 6.8 % — AB (ref 4.0–5.6)

## 2020-05-20 LAB — POCT GLUCOSE (DEVICE FOR HOME USE): POC Glucose: 148 mg/dl — AB (ref 70–99)

## 2020-05-20 MED ORDER — TRULICITY 1.5 MG/0.5ML ~~LOC~~ SOAJ: 1.5000 mg | SUBCUTANEOUS | 1 refills | Status: DC

## 2020-05-20 MED ORDER — LANTUS SOLOSTAR 100 UNIT/ML ~~LOC~~ SOPN: 8.0000 [IU] | PEN_INJECTOR | Freq: Every day | SUBCUTANEOUS | 6 refills | Status: DC

## 2020-05-20 MED ORDER — ATORVASTATIN CALCIUM 10 MG PO TABS: 10.0000 mg | ORAL_TABLET | Freq: Every day | ORAL | 3 refills | Status: DC

## 2020-05-20 NOTE — Progress Notes (Signed)
Name: Chris Harrington  Age/ Sex: 45 y.o., male   MRN/ DOB: [id number], Dec 30, 1975     PCP: Philip Aspen, Limmie Patricia, MD   Reason for Endocrinology Evaluation: Type 2 Diabetes Mellitus  Initial Endocrine Consultative Visit: 02/19/2020    PATIENT IDENTIFIER: [name] is a 45 y.o. male with a past medical history of DM and HTN. The patient has followed with Endocrinology clinic since 02/19/2020 for consultative assistance with management of his diabetes.  DIABETIC HISTORY:  [name] was diagnosed with DM in 01/2020, he was hospitalized with a serum  glucose of 624 mg/dL but no DKA.He was discharged on insulin and Metformin but was unable to swallow the Metformin pills and we switched it to Trulicity.  His hemoglobin A1c was 13.1% upon presentation.     HTN History:  Due to hx of HTn with hypokalemia, we checked aldo: renin , which was normal as well as normal  Dexamethasone suppression test at 0.6 ug/dL. 02/2020   SUBJECTIVE:   During the last visit (02/19/2020): A1c 13.1 % . Stopped Metformin, started Trulicity and continued Lantus   Today (05/20/2020): [name] is here for a follow up on diabetes management.  He checks his blood sugars 1 times daily.The patient has not  had hypoglycemic episodes since the last clinic visit.  HOME DIABETES REGIMEN:  Trulicity 0.75 mg weekly ( Monday )  Lantus 10 units daily   Denies nausea or diarrhea    Statin: no ACE-I/ARB: no    METER DOWNLOAD SUMMARY: Did not bring     DIABETIC COMPLICATIONS: Microvascular complications:    Denies: CKD, neuropathy, retinopathy  Last Eye Exam: Completed 2021  Macrovascular complications:    Denies: CAD, CVA, PVD   HISTORY:  Past Medical History:  Past Medical History:  Diagnosis Date  . Hypertension   . Obesity    Past Surgical History:  Past Surgical History:  Procedure Laterality Date  . TONSILECTOMY, ADENOIDECTOMY, BILATERAL MYRINGOTOMY AND TUBES     pt has not had  tubes in ears    Social History:  reports that he has never smoked. He has never used smokeless tobacco. He reports current alcohol use. He reports that he does not use drugs. Family History:  Family History  Problem Relation Age of Onset  . Hypertension Mother   . Diabetes Mother   . Heart disease Mother   . Stroke Mother   . Hypertension Father   . Cancer Paternal Grandmother        passed from unknown cancer  . Hypertension Maternal Grandmother   . Hypertension Maternal Grandfather      HOME MEDICATIONS: Allergies as of 05/20/2020      Reactions   Cranberry Swelling      Medication List       Accurate as of May 20, 2020  1:09 PM. If you have any questions, ask your nurse or doctor.        Accu-Chek Aviva device Use as instructed   Accu-Chek Softclix Lancets lancets Use once daily for glucose control with Accu Chek Aviva. DxE11.9.   accu-chek soft touch lancets Use once daily for glucose control with Accu Check Aviva  DxE11.9.   amLODipine 10 MG tablet Commonly known as: NORVASC TAKE 1 TABLET(10 MG) BY MOUTH DAILY   BD Pen Needle Micro U/F 32G X 6 MM Misc Generic drug: Insulin Pen Needle Use once daily for glucose control Dx E11.9   diphenhydrAMINE 50 MG capsule Commonly known as: BENADRYL Take 50  mg by mouth every 6 (six) hours as needed.   EPINEPHrine 0.3 mg/0.3 mL Soaj injection Commonly known as: EPI-PEN Inject 0.3 mg into the muscle as needed for anaphylaxis.   famotidine 20 MG tablet Commonly known as: PEPCID Take 1 tablet (20 mg total) by mouth 2 (two) times daily.   Lantus SoloStar 100 UNIT/ML Solostar Pen Generic drug: insulin glargine Inject 10 Units into the skin at bedtime.   Trulicity 0.75 MG/0.5ML Sopn Generic drug: Dulaglutide Inject 0.75 mg into the skin once a week.        OBJECTIVE:   Vital Signs: BP (!) 148/82   Pulse 76   Ht 5\' 3"  (1.6 m)   Wt 193 lb 2 oz (87.6 kg)   SpO2 98%   BMI 34.21 kg/m   Wt Readings  from Last 3 Encounters:  05/20/20 193 lb 2 oz (87.6 kg)  02/19/20 199 lb 9.6 oz (90.5 kg)  01/24/20 203 lb 12.8 oz (92.4 kg)     Exam: General: Pt appears well and is in NAD  Lungs: Clear with good BS bilat with no rales, rhonchi, or wheezes  Heart: RRR   Abdomen: Normoactive bowel sounds, soft, nontender, without masses or organomegaly palpable  Extremities: No pretibial edema.  Neuro: MS is good with appropriate affect, pt is alert and Ox3     DM foot exam: 02/19/2020  The skin of the feet is intact without sores or ulcerations. The pedal pulses are 2+ on right and 2+ on left. The sensation is intact to a screening 5.07, 10 gram monofilament bilaterally    DATA REVIEWED:  Lab Results  Component Value Date   HGBA1C 6.8 (A) 05/20/2020   HGBA1C 13.1 (A) 01/24/2020   HGBA1C 5.9 (H) 07/02/2019   Lab Results  Component Value Date   LDLCALC 122 (H) 07/02/2019   CREATININE 1.10 01/24/2020   No results found for: [facility]   Lab Results  Component Value Date   CHOL 173 07/02/2019   HDL 32 (L) 07/02/2019   LDLCALC 122 (H) 07/02/2019   TRIG 101 07/02/2019   CHOLHDL 5 02/15/2013         ASSESSMENT / PLAN / RECOMMENDATIONS:   1) Type 2 Diabetes Mellitus, Optimally controlled, Without  complications - Most recent A1c of 6.8 %. Goal A1c < 7.0 %.    - Praised the pt on improved glycemic control  - Will increase Trulicity  - Unable to swallow large pills , I offered liquid Metformin but he would like to stay on the current regimen     MEDICATIONS: - Increased Trulicity 1.5  mg weekly  - Decrease  Lantus 8 units daily    EDUCATION / INSTRUCTIONS:  BG monitoring instructions: Patient is instructed to check his blood sugars 1 times a day.  Call [place] [facility] if: BG persistently < 70  . I reviewed the Rule of 15 for the treatment of hypoglycemia in detail with the patient. Literature supplied.    2) Diabetic complications:   Eye: Does not  have known diabetic retinopathy.   Neuro/ Feet: Does not have known diabetic peripheral neuropathy .   Renal: Patient does not have known baseline CKD. He   is not on an ACEI/ARB at present.    3) Dyslipidemia :    - LDL above goal at 122 mg/dL. I have advised him of the benefits of statins. I have cautioned him against arthralgias. Pt agreed for me to write a prescription and he would like  to do his own research    Medication  Atorvastatin 10 mg daily   F/U in 4 months    Signed electronically by: Lyndle Herrlich, MD  [facility]  [facility] [address] [place] [place], [place] [address] Phone: [phone] FAX: [phone]   CC: Philip Aspen, Limmie Patricia, MD [address] [place] [address] Phone: [phone]  Fax: [phone]  Return to Endocrinology clinic as below: No future appointments.

## 2020-05-20 NOTE — Patient Instructions (Addendum)
-   Increased Trulicity 1.5  mg weekly  - Decrease  Lantus 8 units daily  - Start Atorvastatin 10 mg daily ( cholesterol)       HOW TO TREAT LOW BLOOD SUGARS (Blood sugar LESS THAN 70 MG/DL)  Please follow the RULE OF 15 for the treatment of hypoglycemia treatment (when your (blood sugars are less than 70 mg/dL)    STEP 1: Take 15 grams of carbohydrates when your blood sugar is low, which includes:   3-4 GLUCOSE TABS  OR  3-4 OZ OF JUICE OR REGULAR SODA OR  ONE TUBE OF GLUCOSE GEL     STEP 2: RECHECK blood sugar in 15 MINUTES STEP 3: If your blood sugar is still low at the 15 minute recheck --> then, go back to STEP 1 and treat AGAIN with another 15 grams of carbohydrates.

## 2020-09-23 ENCOUNTER — Ambulatory Visit (INDEPENDENT_AMBULATORY_CARE_PROVIDER_SITE_OTHER): Payer: 59 | Admitting: Internal Medicine

## 2020-09-23 ENCOUNTER — Other Ambulatory Visit: Payer: Self-pay

## 2020-09-23 ENCOUNTER — Encounter: Payer: Self-pay | Admitting: Internal Medicine

## 2020-09-23 VITALS — BP 143/92 | HR 80 | Temp 98.2°F | Resp 12 | Ht 63.0 in | Wt 196.6 lb

## 2020-09-23 DIAGNOSIS — E785 Hyperlipidemia, unspecified: Secondary | ICD-10-CM

## 2020-09-23 DIAGNOSIS — E119 Type 2 diabetes mellitus without complications: Secondary | ICD-10-CM | POA: Diagnosis not present

## 2020-09-23 LAB — POCT GLYCOSYLATED HEMOGLOBIN (HGB A1C): Hemoglobin A1C: 5.3 % (ref 4.0–5.6)

## 2020-09-23 NOTE — Progress Notes (Signed)
Name: Chris Harrington  Age/ Sex: 45 y.o., male   MRN/ DOB: [id number], Dec 29, 1975     PCP: Philip Aspen, Limmie Patricia, MD   Reason for Endocrinology Evaluation: Type 2 Diabetes Mellitus  Initial Endocrine Consultative Visit: 02/19/2020    PATIENT IDENTIFIER: Chris Harrington is a 45 y.o. male with a past medical history of DM and HTN. The patient has followed with Endocrinology clinic since 02/19/2020 for consultative assistance with management of his diabetes.  DIABETIC HISTORY:  Chris Harrington was diagnosed with DM in 01/2020, he was hospitalized with a serum  glucose of 624 mg/dL but no DKA.He was discharged on insulin and Metformin but was unable to swallow the Metformin pills and we switched it to Trulicity.  His hemoglobin A1c was 13.1% upon presentation.     HTN History:  Due to hx of HTn with hypokalemia, we checked aldo: renin , which was normal as well as normal  Dexamethasone suppression test at 0.6 ug/dL. 02/2020   DYSLIPIDEMIA HISTORY:  LDL above goal at 122 mg/dL. We started Atorvastatin in 05/2020  SUBJECTIVE:   During the last visit (05/20/2020): A1c 6.8 % . We increased  Trulicity and adjusted Lantus      Today (09/23/2020): Chris Harrington is here for a follow up on diabetes management.  He checks his blood sugars 3 times weekly.The patient has not  had hypoglycemic episodes since the last clinic visit.   Denies nausea or vomiting  or diarrhea    HOME DIABETES REGIMEN:  Trulicity 1.5 mg weekly ( Monday )  Lantus 8 units daily  Atorvastatin 10 mg daily - did not start       Statin: no ACE-I/ARB: no    METER DOWNLOAD SUMMARY: Did not bring     DIABETIC COMPLICATIONS: Microvascular complications:   Denies: CKD, neuropathy, retinopathy Last Eye Exam: Completed 02/2020  Macrovascular complications:   Denies: CAD, CVA, PVD   HISTORY:  Past Medical History:  Past Medical History:  Diagnosis Date   Hypertension    Obesity    Past Surgical History:   Past Surgical History:  Procedure Laterality Date   TONSILECTOMY, ADENOIDECTOMY, BILATERAL MYRINGOTOMY AND TUBES     pt has not had tubes in ears   Social History:  reports that he has never smoked. He has never used smokeless tobacco. He reports current alcohol use. He reports that he does not use drugs. Family History:  Family History  Problem Relation Age of Onset   Hypertension Mother    Diabetes Mother    Heart disease Mother    Stroke Mother    Hypertension Father    Cancer Paternal Grandmother        passed from unknown cancer   Hypertension Maternal Grandmother    Hypertension Maternal Grandfather      HOME MEDICATIONS: Allergies as of 09/23/2020       Reactions   Cranberry Swelling        Medication List        Accurate as of September 23, 2020  1:22 PM. If you have any questions, ask your nurse or doctor.          Accu-Chek Aviva device Use as instructed   Accu-Chek Softclix Lancets lancets Use once daily for glucose control with Accu Chek Aviva. DxE11.9.   accu-chek soft touch lancets Use once daily for glucose control with Accu Check Aviva  DxE11.9.   amLODipine 10 MG tablet Commonly known as: NORVASC TAKE 1 TABLET(10 MG) BY MOUTH DAILY  atorvastatin 10 MG tablet Commonly known as: LIPITOR Take 1 tablet (10 mg total) by mouth daily.   BD Pen Needle Micro U/F 32G X 6 MM Misc Generic drug: Insulin Pen Needle Use once daily for glucose control Dx E11.9   diphenhydrAMINE 50 MG capsule Commonly known as: BENADRYL Take 50 mg by mouth every 6 (six) hours as needed.   EPINEPHrine 0.3 mg/0.3 mL Soaj injection Commonly known as: EPI-PEN Inject 0.3 mg into the muscle as needed for anaphylaxis.   famotidine 20 MG tablet Commonly known as: PEPCID Take 1 tablet (20 mg total) by mouth 2 (two) times daily.   Lantus SoloStar 100 UNIT/ML Solostar Pen Generic drug: insulin glargine Inject 8 Units into the skin at bedtime.   Trulicity 1.5 MG/0.5ML  Sopn Generic drug: Dulaglutide Inject 1.5 mg into the skin once a week.         OBJECTIVE:   Vital Signs: BP (!) 143/92 (BP Location: Right Arm, Cuff Size: Large)   Pulse 80   Temp 98.2 F (36.8 C) (Oral)   Resp 12   Ht 5\' 3"  (1.6 m)   Wt 196 lb 9.6 oz (89.2 kg)   SpO2 100%   BMI 34.83 kg/m   Wt Readings from Last 3 Encounters:  09/23/20 196 lb 9.6 oz (89.2 kg)  05/20/20 193 lb 2 oz (87.6 kg)  02/19/20 199 lb 9.6 oz (90.5 kg)     Exam: General: Pt appears well and is in NAD  Lungs: Clear with good BS bilat with no rales, rhonchi, or wheezes  Heart: RRR   Abdomen: Normoactive bowel sounds, soft, nontender, without masses or organomegaly palpable  Extremities: No pretibial edema.  Neuro: MS is good with appropriate affect, pt is alert and Ox3     DM foot exam: 02/19/2020   The skin of the feet is intact without sores or ulcerations. The pedal pulses are 2+ on right and 2+ on left. The sensation is intact to a screening 5.07, 10 gram monofilament bilaterally    DATA REVIEWED:  Lab Results  Component Value Date   HGBA1C 5.3 09/23/2020   HGBA1C 6.8 (A) 05/20/2020   HGBA1C 13.1 (A) 01/24/2020   Results for RONAV, FURNEY (MRN Gelene Mink) as of 09/24/2020 15:50  Ref. Range 09/23/2020 13:30  Sodium Latest Ref Range: 135 - 145 mEq/L 138  Potassium Latest Ref Range: 3.5 - 5.1 mEq/L 3.4 (L)  Chloride Latest Ref Range: 96 - 112 mEq/L 102  CO2 Latest Ref Range: 19 - 32 mEq/L 28  Glucose Latest Ref Range: 70 - 99 mg/dL 86  BUN Latest Ref Range: 6 - 23 mg/dL 17  Creatinine Latest Ref Range: 0.40 - 1.50 mg/dL 09/25/2020  Calcium Latest Ref Range: 8.4 - 10.5 mg/dL 9.0  GFR Latest Ref Range: >60.00 mL/min 62.95  Total CHOL/HDL Ratio Unknown 5  Cholesterol Latest Ref Range: 0 - 200 mg/dL 6.06  HDL Cholesterol Latest Ref Range: >39.00 mg/dL 301 (L)  LDL (calc) Latest Ref Range: 0 - 99 mg/dL 60.10 (H)  NonHDL Unknown 137.35  Triglycerides Latest Ref Range: 0.0 - 149.0 mg/dL 932   VLDL Latest Ref Range: 0.0 - 40.0 mg/dL 35.5     ASSESSMENT / PLAN / RECOMMENDATIONS:   1) Type 2 Diabetes Mellitus, Optimally controlled, Without  complications - Most recent A1c of 5.3 %. Goal A1c < 7.0 %.    - Praised the pt on improved glycemic control  -We will stop insulin and continue Trulicity - Unable to swallow  large pills , hence he was unable to try metformin  MEDICATIONS: -Continue Trulicity 1.5  mg weekly  -Stop Lantus 8 units daily    EDUCATION / INSTRUCTIONS: BG monitoring instructions: Patient is instructed to check his blood sugars 1 times a day. Call [place] [facility] if: BG persistently < 70  I reviewed the Rule of 15 for the treatment of hypoglycemia in detail with the patient. Literature supplied.    2) Diabetic complications:  Eye: Does not have known diabetic retinopathy.  Neuro/ Feet: Does not have known diabetic peripheral neuropathy .  Renal: Patient does not have known baseline CKD. He   is not on an ACEI/ARB at present.    3) Dyslipidemia :    - LDL above goal at 122 mg/dL.  I have attempted to prescribe atorvastatin in the past, but today he states that he would like to try lifestyle changes.  I have explained to the patient that statins provide more than just reducing LDL but also did provide cardiovascular protection. -I will leave this up to him and starting atorvastatin or not  Medication  Atorvastatin 10 mg daily   F/U in 4 months    Signed electronically by: Lyndle Herrlich, MD  [facility]  [facility] [address] [place] [place], [place] [address] Phone: [phone] FAX: [phone]   CC: Philip Aspen, Limmie Patricia, MD [address] [place] [address] Phone: [phone]  Fax: [phone]  Return to Endocrinology clinic as below: No future appointments.

## 2020-09-23 NOTE — Patient Instructions (Addendum)
-   Keep Up the Good Work ! A1c 5.3 %  - Continue Trulicity 1.5  mg weekly  - Stop  Lantus      HOW TO TREAT LOW BLOOD SUGARS (Blood sugar LESS THAN 70 MG/DL) Please follow the RULE OF 15 for the treatment of hypoglycemia treatment (when your (blood sugars are less than 70 mg/dL)   STEP 1: Take 15 grams of carbohydrates when your blood sugar is low, which includes:  3-4 GLUCOSE TABS  OR 3-4 OZ OF JUICE OR REGULAR SODA OR ONE TUBE OF GLUCOSE GEL    STEP 2: RECHECK blood sugar in 15 MINUTES STEP 3: If your blood sugar is still low at the 15 minute recheck --> then, go back to STEP 1 and treat AGAIN with another 15 grams of carbohydrates.

## 2020-09-24 LAB — LIPID PANEL
Cholesterol: 170 mg/dL (ref 0–200)
HDL: 32.7 mg/dL — ABNORMAL LOW (ref >39.00)
LDL Cholesterol: 122 mg/dL — ABNORMAL HIGH (ref 0–99)
NonHDL: 137.35
Total CHOL/HDL Ratio: 5
Triglycerides: 75 mg/dL (ref 0.0–149.0)
VLDL: 15 mg/dL (ref 0.0–40.0)

## 2020-09-24 LAB — BASIC METABOLIC PANEL
BUN: 17 mg/dL (ref 6–23)
CO2: 28 mEq/L (ref 19–32)
Calcium: 9 mg/dL (ref 8.4–10.5)
Chloride: 102 mEq/L (ref 96–112)
Creatinine, Ser: 1.38 mg/dL (ref 0.40–1.50)
GFR: 62.95 mL/min (ref >60.00)
Glucose, Bld: 86 mg/dL (ref 70–99)
Potassium: 3.4 mEq/L — ABNORMAL LOW (ref 3.5–5.1)
Sodium: 138 mEq/L (ref 135–145)

## 2020-11-19 ENCOUNTER — Other Ambulatory Visit: Payer: Self-pay | Admitting: Internal Medicine

## 2021-03-24 ENCOUNTER — Ambulatory Visit: Payer: 59 | Admitting: Internal Medicine

## 2021-03-24 NOTE — Progress Notes (Deleted)
Name: Chris Harrington  Age/ Sex: 45 y.o., male   MRN/ DOB: [id number], 1976/02/23     PCP: Philip Aspen, Limmie Patricia, MD   Reason for Endocrinology Evaluation: Type 2 Diabetes Mellitus  Initial Endocrine Consultative Visit: 02/19/2020    PATIENT IDENTIFIER: Chris Harrington is a 45 y.o. male with a past medical history of DM and HTN. The patient has followed with Endocrinology clinic since 02/19/2020 for consultative assistance with management of his diabetes.  DIABETIC HISTORY:  Chris Harrington was diagnosed with DM in 01/2020, he was hospitalized with a serum  glucose of 624 mg/dL but no DKA.He was discharged on insulin and Metformin but was unable to swallow the Metformin pills and we switched it to Trulicity.  His hemoglobin A1c was 13.1% upon presentation.   Stopped basal insulin 09/2020 with an A1c 5.3%   HTN History:  Due to hx of HTn with hypokalemia, we checked aldo: renin , which was normal as well as normal  Dexamethasone suppression test at 0.6 ug/dL. 02/2020   DYSLIPIDEMIA HISTORY:  LDL above goal at 122 mg/dL. We started Atorvastatin in 05/2020  SUBJECTIVE:   During the last visit (09/23/2020): A1c 6.8 % . We icontinued   Trulicity and stopped      Today (03/24/2021): Chris Harrington is here for a follow up on diabetes management.  He checks his blood sugars 3 times weekly.The patient has not  had hypoglycemic episodes since the last clinic visit.   Denies nausea or vomiting  or diarrhea    HOME DIABETES REGIMEN:  Trulicity 1.5 mg weekly ( Monday )  Atorvastatin 10 mg daily      Statin: no ACE-I/ARB: no    METER DOWNLOAD SUMMARY: Did not bring     DIABETIC COMPLICATIONS: Microvascular complications:   Denies: CKD, neuropathy, retinopathy Last Eye Exam: Completed 02/2020  Macrovascular complications:   Denies: CAD, CVA, PVD   HISTORY:  Past Medical History:  Past Medical History:  Diagnosis Date   Hypertension    Obesity    Past Surgical History:   Past Surgical History:  Procedure Laterality Date   TONSILECTOMY, ADENOIDECTOMY, BILATERAL MYRINGOTOMY AND TUBES     pt has not had tubes in ears   Social History:  reports that he has never smoked. He has never used smokeless tobacco. He reports current alcohol use. He reports that he does not use drugs. Family History:  Family History  Problem Relation Age of Onset   Hypertension Mother    Diabetes Mother    Heart disease Mother    Stroke Mother    Hypertension Father    Cancer Paternal Grandmother        passed from unknown cancer   Hypertension Maternal Grandmother    Hypertension Maternal Grandfather      HOME MEDICATIONS: Allergies as of 03/24/2021       Reactions   Cranberry Swelling        Medication List        Accurate as of March 24, 2021  7:43 AM. If you have any questions, ask your nurse or doctor.          Accu-Chek Softclix Lancets lancets Use once daily for glucose control with Accu Chek Aviva. DxE11.9.   accu-chek soft touch lancets Use once daily for glucose control with Accu Check Aviva  DxE11.9.   amLODipine 10 MG tablet Commonly known as: NORVASC TAKE 1 TABLET(10 MG) BY MOUTH DAILY   atorvastatin 10 MG tablet Commonly known as: LIPITOR  Take 1 tablet (10 mg total) by mouth daily.   diphenhydrAMINE 50 MG capsule Commonly known as: BENADRYL Take 50 mg by mouth every 6 (six) hours as needed.   EPINEPHrine 0.3 mg/0.3 mL Soaj injection Commonly known as: EPI-PEN Inject 0.3 mg into the muscle as needed for anaphylaxis.   famotidine 20 MG tablet Commonly known as: PEPCID Take 1 tablet (20 mg total) by mouth 2 (two) times daily.   Trulicity 1.5 MG/0.5ML Sopn Generic drug: Dulaglutide ADMINISTER 1.5 MG UNDER THE SKIN 1 TIME A WEEK         OBJECTIVE:   Vital Signs: There were no vitals taken for this visit.  Wt Readings from Last 3 Encounters:  09/23/20 196 lb 9.6 oz (89.2 kg)  05/20/20 193 lb 2 oz (87.6 kg)  02/19/20 199  lb 9.6 oz (90.5 kg)     Exam: General: Pt appears well and is in NAD  Lungs: Clear with good BS bilat with no rales, rhonchi, or wheezes  Heart: RRR   Abdomen: Normoactive bowel sounds, soft, nontender, without masses or organomegaly palpable  Extremities: No pretibial edema.  Neuro: MS is good with appropriate affect, pt is alert and Ox3     DM foot exam: 02/19/2020   The skin of the feet is intact without sores or ulcerations. The pedal pulses are 2+ on right and 2+ on left. The sensation is intact to a screening 5.07, 10 gram monofilament bilaterally    DATA REVIEWED:  Lab Results  Component Value Date   HGBA1C 5.3 09/23/2020   HGBA1C 6.8 (A) 05/20/2020   HGBA1C 13.1 (A) 01/24/2020   Results for MACKLIN, JACQUIN (MRN [id number]) as of 09/24/2020 15:50  Ref. Range 09/23/2020 13:30  Sodium Latest Ref Range: 135 - 145 mEq/L 138  Potassium Latest Ref Range: 3.5 - 5.1 mEq/L 3.4 (L)  Chloride Latest Ref Range: 96 - 112 mEq/L 102  CO2 Latest Ref Range: 19 - 32 mEq/L 28  Glucose Latest Ref Range: 70 - 99 mg/dL 86  BUN Latest Ref Range: 6 - 23 mg/dL 17  Creatinine Latest Ref Range: 0.40 - 1.50 mg/dL 4.53  Calcium Latest Ref Range: 8.4 - 10.5 mg/dL 9.0  GFR Latest Ref Range: >60.00 mL/min 62.95  Total CHOL/HDL Ratio Unknown 5  Cholesterol Latest Ref Range: 0 - 200 mg/dL 646  HDL Cholesterol Latest Ref Range: >39.00 mg/dL 80.32 (L)  LDL (calc) Latest Ref Range: 0 - 99 mg/dL 122 (H)  NonHDL Unknown 137.35  Triglycerides Latest Ref Range: 0.0 - 149.0 mg/dL 48.2  VLDL Latest Ref Range: 0.0 - 40.0 mg/dL 50.0     ASSESSMENT / PLAN / RECOMMENDATIONS:   1) Type 2 Diabetes Mellitus, Optimally controlled, Without  complications - Most recent A1c of 5.3 %. Goal A1c < 7.0 %.    - Praised the pt on improved glycemic control  -We will stop insulin and continue Trulicity - Unable to swallow large pills , hence he was unable to try metformin  MEDICATIONS: -Continue Trulicity 1.5  mg  weekly  -Stop Lantus 8 units daily    EDUCATION / INSTRUCTIONS: BG monitoring instructions: Patient is instructed to check his blood sugars 1 times a day. Call [facility] if: BG persistently < 70  I reviewed the Rule of 15 for the treatment of hypoglycemia in detail with the patient. Literature supplied.    2) Diabetic complications:  Eye: Does not have known diabetic retinopathy.  Neuro/ Feet: Does not have known diabetic peripheral  neuropathy .  Renal: Patient does not have known baseline CKD. He   is not on an ACEI/ARB at present.    3) Dyslipidemia :    - LDL above goal at 122 mg/dL.  I have attempted to prescribe atorvastatin in the past, but today he states that he would like to try lifestyle changes.  I have explained to the patient that statins provide more than just reducing LDL but also did provide cardiovascular protection. -I will leave this up to him and starting atorvastatin or not  Medication  Atorvastatin 10 mg daily   F/U in 4 months    Signed electronically by: Lyndle Herrlich, MD  [facility] Endocr[facility]ology  [facility] [address] [place] [place], [place] [address] Phone: [phone] FAX: [phone]   CC: Philip Aspen, Limmie Patricia, MD [address] [place] [address] Phone: [phone]  Fax: [phone]  Return to Endocrinology clinic as below: Future Appointments  Date Time Provider Department Center  03/24/2021  1:20 PM Celeste Tavenner, Konrad Dolores, MD [facility]

## 2021-05-07 IMAGING — DX DG ANKLE COMPLETE 3+V*L*
3 series · 3 of 3 positions shown · non-contrast
Comparison: None.

CLINICAL DATA: Medial malleolar pain for 2 days

EXAM:
LEFT ANKLE COMPLETE - 3+ VIEW

[ankle ap]
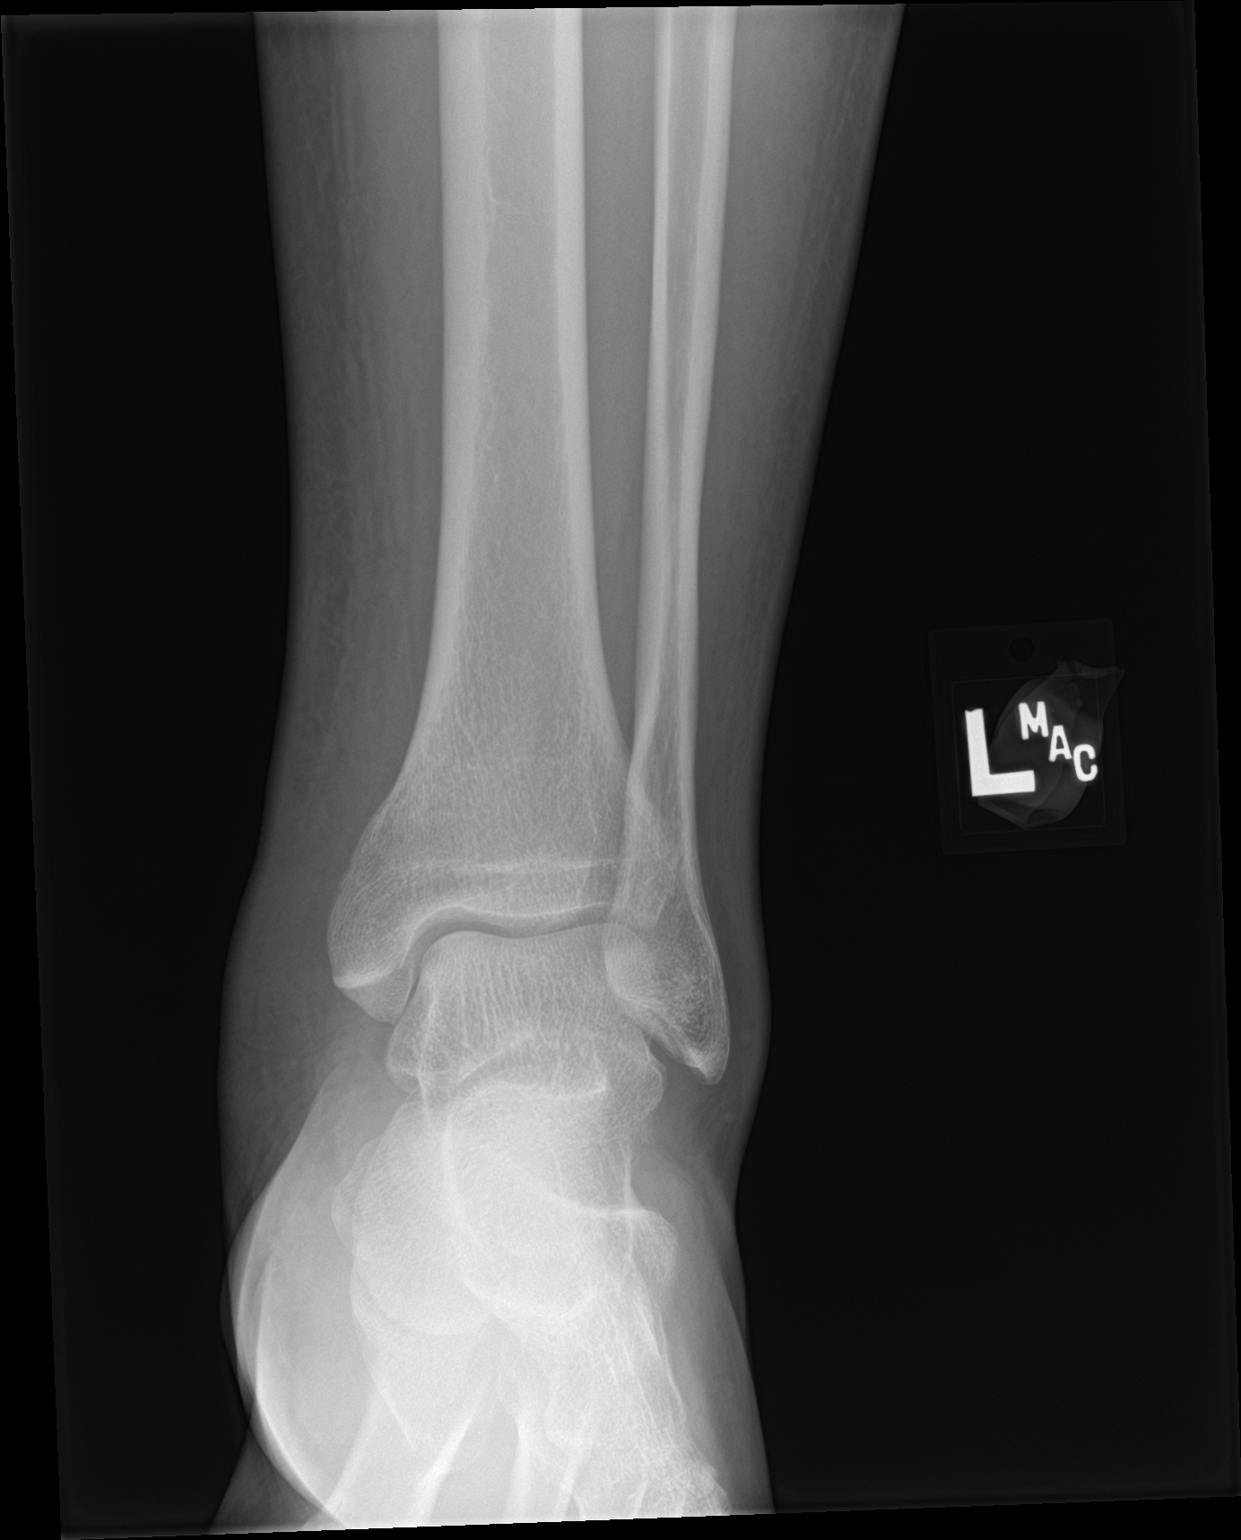

[ankle obl]
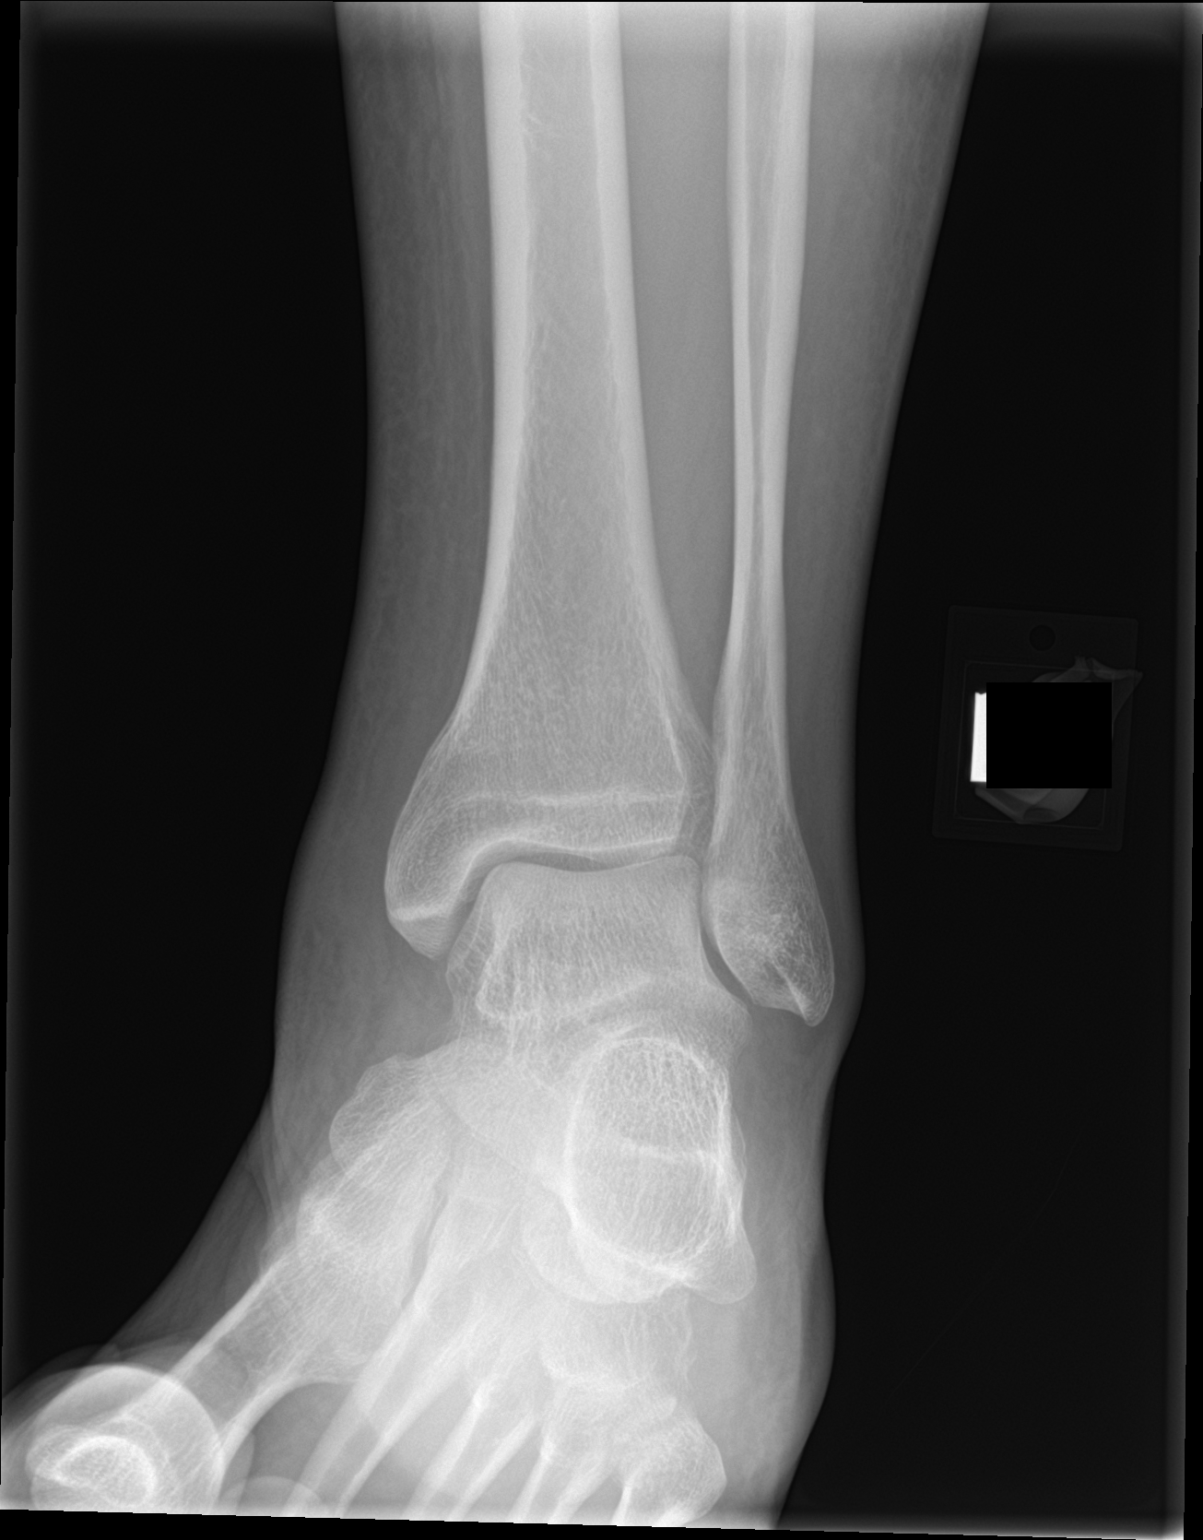

[ankle lat]
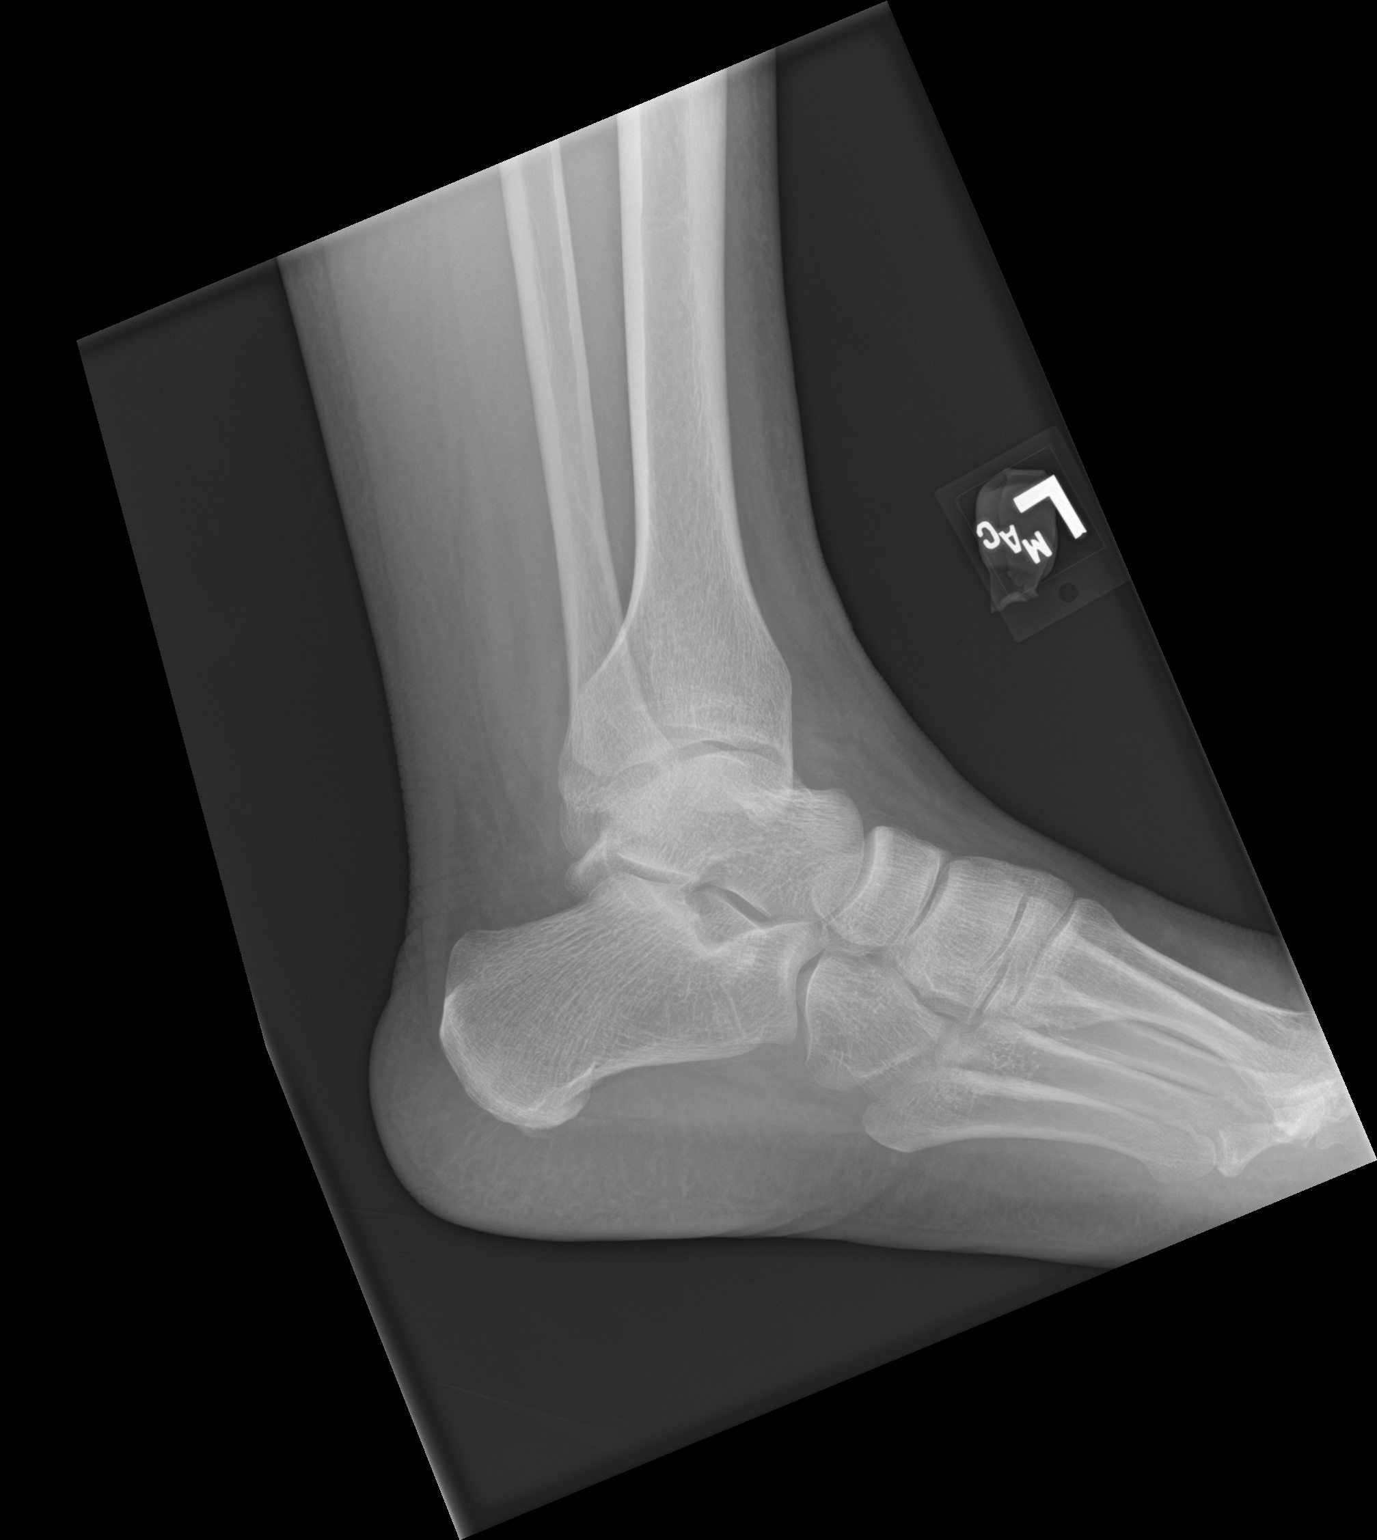

[3 of 3 positions shown; findings below may reference images not displayed]

FINDINGS: Frontal, oblique, and lateral views of the left ankle demonstrate no
fractures. Alignment is anatomic. Joint spaces are well preserved.
Diffuse soft tissue edema greatest medially.
IMPRESSION: 1. Medial soft tissue swelling.  No acute bony abnormality.

## 2021-05-10 ENCOUNTER — Other Ambulatory Visit: Payer: Self-pay | Admitting: Internal Medicine

## 2021-05-10 DIAGNOSIS — I10 Essential (primary) hypertension: Secondary | ICD-10-CM

## 2021-05-12 ENCOUNTER — Encounter: Payer: Self-pay | Admitting: Internal Medicine

## 2021-05-12 ENCOUNTER — Ambulatory Visit (INDEPENDENT_AMBULATORY_CARE_PROVIDER_SITE_OTHER): Payer: Self-pay | Admitting: Internal Medicine

## 2021-05-12 VITALS — BP 168/94 | HR 89 | Ht 63.0 in | Wt 210.0 lb

## 2021-05-12 DIAGNOSIS — E785 Hyperlipidemia, unspecified: Secondary | ICD-10-CM

## 2021-05-12 DIAGNOSIS — T485X5A Adverse effect of other anti-common-cold drugs, initial encounter: Secondary | ICD-10-CM

## 2021-05-12 DIAGNOSIS — E119 Type 2 diabetes mellitus without complications: Secondary | ICD-10-CM

## 2021-05-12 DIAGNOSIS — J310 Chronic rhinitis: Secondary | ICD-10-CM

## 2021-05-12 LAB — POCT GLYCOSYLATED HEMOGLOBIN (HGB A1C): Hemoglobin A1C: 5.5 % (ref 4.0–5.6)

## 2021-05-12 LAB — POCT GLUCOSE (DEVICE FOR HOME USE): Glucose Fasting, POC: 105 mg/dL — AB (ref 70–99)

## 2021-05-12 MED ORDER — TRULICITY 1.5 MG/0.5ML ~~LOC~~ SOAJ: 1.5000 mg | SUBCUTANEOUS | 3 refills | Status: DC

## 2021-05-12 MED ORDER — FLUTICASONE PROPIONATE 50 MCG/ACT NA SUSP: 1.0000 | Freq: Every day | NASAL | 0 refills | Status: DC

## 2021-05-12 NOTE — Progress Notes (Signed)
Name: Chris Harrington  Age/ Sex: 46 y.o., male   MRN/ DOB: [id number], 09/23/1975     PCP: Philip Aspen, Limmie Patricia, MD   Reason for Endocrinology Evaluation: Type 2 Diabetes Mellitus  Initial Endocrine Consultative Visit: 02/19/2020    PATIENT IDENTIFIER: Chris Harrington is a 46 y.o. male with a past medical history of DM and HTN. The patient has followed with Endocrinology clinic since 02/19/2020 for consultative assistance with management of his diabetes.  DIABETIC HISTORY:  Chris Harrington was diagnosed with DM in 01/2020, he was hospitalized with a serum  glucose of 624 mg/dL but no DKA.He was discharged on insulin and Metformin but was unable to swallow the Metformin pills and we switched it to Trulicity.  His hemoglobin A1c was 13.1% upon presentation.     HTN History:  Due to hx of HTn with hypokalemia, we checked aldo: renin , which was normal as well as normal  Dexamethasone suppression test at 0.6 ug/dL. 02/2020   DYSLIPIDEMIA HISTORY:  LDL above goal at 122 mg/dL. We started Atorvastatin in 05/2020  SUBJECTIVE:   During the last visit (05/20/2020): A1c 6.8 % . We increased  Trulicity and adjusted Lantus      Today (05/12/2021): Chris Harrington is here for a follow up on diabetes management. He has not been to our clinic in 8 months.  He checks his blood sugars 3 times weekly.The patient has not  had hypoglycemic episodes since the last clinic visit.   Denies nausea or vomiting  or diarrhea  He has been congested for the past 4 months and using nasal spray  Denies cough   HOME DIABETES REGIMEN:  Trulicity 1.5 mg weekly ( Monday )  Atorvastatin 10 mg daily - did not start       Statin: no ACE-I/ARB: no    METER DOWNLOAD SUMMARY: Did not bring     DIABETIC COMPLICATIONS: Microvascular complications:   Denies: CKD, neuropathy, retinopathy Last Eye Exam: Completed 02/2020  Macrovascular complications:   Denies: CAD, CVA, PVD   HISTORY:  Past Medical  History:  Past Medical History:  Diagnosis Date   Hypertension    Obesity    Past Surgical History:  Past Surgical History:  Procedure Laterality Date   TONSILECTOMY, ADENOIDECTOMY, BILATERAL MYRINGOTOMY AND TUBES     Chris Harrington has not had tubes in ears   Social History:  reports that he has never smoked. He has never used smokeless tobacco. He reports current alcohol use. He reports that he does not use drugs. Family History:  Family History  Problem Relation Age of Onset   Hypertension Mother    Diabetes Mother    Heart disease Mother    Stroke Mother    Hypertension Father    Cancer Paternal Grandmother        passed from unknown cancer   Hypertension Maternal Grandmother    Hypertension Maternal Grandfather      HOME MEDICATIONS: Allergies as of 05/12/2021       Reactions   Cranberry Swelling        Medication List        Accurate as of May 12, 2021  1:36 PM. If you have any questions, ask your nurse or doctor.          Accu-Chek Softclix Lancets lancets Use once daily for glucose control with Accu Chek Aviva. DxE11.9.   accu-chek soft touch lancets Use once daily for glucose control with Accu Check Aviva  DxE11.9.   amLODipine 10  MG tablet Commonly known as: NORVASC TAKE 1 TABLET(10 MG) BY MOUTH DAILY   atorvastatin 10 MG tablet Commonly known as: LIPITOR Take 1 tablet (10 mg total) by mouth daily.   diphenhydrAMINE 50 MG capsule Commonly known as: BENADRYL Take 50 mg by mouth every 6 (six) hours as needed.   EPINEPHrine 0.3 mg/0.3 mL Soaj injection Commonly known as: EPI-PEN Inject 0.3 mg into the muscle as needed for anaphylaxis.   famotidine 20 MG tablet Commonly known as: PEPCID Take 1 tablet (20 mg total) by mouth 2 (two) times daily.   Trulicity 1.5 MG/0.5ML Sopn Generic drug: Dulaglutide ADMINISTER 1.5 MG UNDER THE SKIN 1 TIME A WEEK         OBJECTIVE:   Vital Signs: BP (!) 168/94 (BP Location: Left Arm, Patient Position:  Sitting, Cuff Size: Small)    Pulse 89    Ht 5\' 3"  (1.6 m)    Wt 210 lb (95.3 kg)    SpO2 98%    BMI 37.20 kg/m   Wt Readings from Last 3 Encounters:  05/12/21 210 lb (95.3 kg)  09/23/20 196 lb 9.6 oz (89.2 kg)  05/20/20 193 lb 2 oz (87.6 kg)     Exam: General: Chris Harrington appears well and is in NAD Nasal turbinates swollen and erythematous with the left nostril ulceration  Lungs: Clear with good BS bilat with no rales, rhonchi, or wheezes  Heart: RRR   Abdomen: Normoactive bowel sounds, soft, nontender, without masses or organomegaly palpable  Extremities: No pretibial edema.  Neuro: MS is good with appropriate affect, Chris Harrington is alert and Ox3     DM foot exam: 05/12/2020   The skin of the feet is intact without sores or ulcerations. The pedal pulses are 2+ on right and 2+ on left. The sensation is intact to a screening 5.07, 10 gram monofilament bilaterally    DATA REVIEWED:  Lab Results  Component Value Date   HGBA1C 5.5 05/12/2021   HGBA1C 5.3 09/23/2020   HGBA1C 6.8 (A) 05/20/2020       ASSESSMENT / PLAN / RECOMMENDATIONS:   1) Type 2 Diabetes Mellitus, Optimally controlled, Without  complications - Most recent A1c of 5.5 %. Goal A1c < 7.0 %.    - Praised the Chris Harrington on improved glycemic control  -I have offered reducing his Trulicity but he is comfortable with the current regimen - Unable to swallow large pills , hence he was unable to try metformin  MEDICATIONS: -Continue Trulicity 1.5  mg weekly     EDUCATION / INSTRUCTIONS: BG monitoring instructions: Patient is instructed to check his blood sugars 1 times a day. Call [facility] if: BG persistently < 70  I reviewed the Rule of 15 for the treatment of hypoglycemia in detail with the patient. Literature supplied.    2) Diabetic complications:  Eye: Does not have known diabetic retinopathy.  Neuro/ Feet: Does not have known diabetic peripheral neuropathy .  Renal: Patient does not have known baseline  CKD. He   is not on an ACEI/ARB at present.    3) Dyslipidemia :    -His LDL is above goal, I have attempted multiple times to start him on statin therapy but he declines -He understands the cardiovascular benefits to statins   4) rhinitis medicamentosa:   -I have asked the patient to avoid all OTC medications especially decongestants.  I have prescribed Flonase and advised him to use plain antihistamine such as Claritin or Zyrtec for the next 2  weeks   Follow-up in 6 months     Signed electronically by: Lyndle Herrlich, MD  [facility] Endocrinology  [facility] [address] [place] [place], [place] [address] Phone: [phone] FAX: [phone]   CC: Philip Aspen, Limmie Patricia, MD [address] [place] [address] Phone: [phone]  Fax: [phone]  Return to Endocrinology clinic as below: No future appointments.

## 2021-05-12 NOTE — Patient Instructions (Addendum)
-   Keep Up the Good Work ! A1c 5.5 %  - Continue Trulicity 1.5  mg weekly     For nasal congestion  Start Flonase 1 spray to each nostril once daily  Start Claritin 10 mg  daily      HOW TO TREAT LOW BLOOD SUGARS (Blood sugar LESS THAN 70 MG/DL) Please follow the RULE OF 15 for the treatment of hypoglycemia treatment (when your (blood sugars are less than 70 mg/dL)   STEP 1: Take 15 grams of carbohydrates when your blood sugar is low, which includes:  3-4 GLUCOSE TABS  OR 3-4 OZ OF JUICE OR REGULAR SODA OR ONE TUBE OF GLUCOSE GEL    STEP 2: RECHECK blood sugar in 15 MINUTES STEP 3: If your blood sugar is still low at the 15 minute recheck --> then, go back to STEP 1 and treat AGAIN with another 15 grams of carbohydrates.

## 2021-05-13 ENCOUNTER — Other Ambulatory Visit: Payer: Self-pay | Admitting: Internal Medicine

## 2021-05-13 DIAGNOSIS — I10 Essential (primary) hypertension: Secondary | ICD-10-CM

## 2021-05-13 LAB — MICROALBUMIN / CREATININE URINE RATIO
Creatinine,U: 196.4 mg/dL
Microalb Creat Ratio: 21.2 mg/g (ref 0.0–30.0)
Microalb, Ur: 41.6 mg/dL — ABNORMAL HIGH (ref 0.0–1.9)

## 2021-05-14 DIAGNOSIS — J310 Chronic rhinitis: Secondary | ICD-10-CM | POA: Insufficient documentation

## 2021-05-14 DIAGNOSIS — T485X5A Adverse effect of other anti-common-cold drugs, initial encounter: Secondary | ICD-10-CM | POA: Insufficient documentation

## 2021-05-14 LAB — LIPID PANEL
Cholesterol: 168 mg/dL (ref 0–200)
HDL: 32.4 mg/dL — ABNORMAL LOW (ref >39.00)
LDL Cholesterol: 108 mg/dL — ABNORMAL HIGH (ref 0–99)
NonHDL: 135.28
Total CHOL/HDL Ratio: 5
Triglycerides: 135 mg/dL (ref 0.0–149.0)
VLDL: 27 mg/dL (ref 0.0–40.0)

## 2021-05-14 LAB — BASIC METABOLIC PANEL
BUN: 15 mg/dL (ref 6–23)
CO2: 33 mEq/L — ABNORMAL HIGH (ref 19–32)
Calcium: 9 mg/dL (ref 8.4–10.5)
Chloride: 103 mEq/L (ref 96–112)
Creatinine, Ser: 1.5 mg/dL (ref 0.40–1.50)
GFR: 56.7 mL/min — ABNORMAL LOW (ref >60.00)
Glucose, Bld: 83 mg/dL (ref 70–99)
Potassium: 3.4 mEq/L — ABNORMAL LOW (ref 3.5–5.1)
Sodium: 141 mEq/L (ref 135–145)

## 2021-06-22 ENCOUNTER — Other Ambulatory Visit (HOSPITAL_COMMUNITY): Payer: Self-pay

## 2021-06-22 ENCOUNTER — Telehealth: Payer: Self-pay | Admitting: Pharmacy Technician

## 2021-06-22 NOTE — Telephone Encounter (Signed)
Patient Advocate Encounter ? ?Received notification from [employer] that prior authorization for TRULICITY 1.5MG  is required. ?  ?PA submitted on 3.20.23 ?Key [id number] ?Status is pending ?  ?[facility] will continue to follow ? ?Kyliegh Jester R Temprance Wyre, CPhT ?Patient Advocate ?[facility] ?Phone: [phone] ?Fax:  [phone] ? ?

## 2021-06-29 ENCOUNTER — Other Ambulatory Visit (HOSPITAL_COMMUNITY): Payer: Self-pay

## 2021-06-29 NOTE — Telephone Encounter (Signed)
Patient Advocate Encounter ? ?Prior Authorization for Trulicity 1.5mg /0.94ml pen injectors has been approved.   ? ?PA# [id number] ? ?Effective dates: 06/22/21 through 06/23/22 ? ?Per Test Claim Patients co-pay is $25. (90 DS) ? ?Spoke with Pharmacy to Process. ? ?Patient Advocate ?Fax: [phone]  ?

## 2021-10-27 ENCOUNTER — Telehealth: Payer: Self-pay | Admitting: Internal Medicine

## 2021-10-27 DIAGNOSIS — I10 Essential (primary) hypertension: Secondary | ICD-10-CM

## 2021-10-27 MED ORDER — AMLODIPINE BESYLATE 10 MG PO TABS: ORAL_TABLET | ORAL | 0 refills | Status: DC

## 2021-10-27 NOTE — Telephone Encounter (Signed)
Pt has cpe sch for 10-17 and would like refills on amLODipine (NORVASC) 10 MG tablet  [id number] - [place], [place] - [address] AT [facility] [address] Phone:  [phone]  Fax:  [phone]

## 2021-11-11 ENCOUNTER — Encounter (INDEPENDENT_AMBULATORY_CARE_PROVIDER_SITE_OTHER): Payer: Self-pay

## 2021-11-17 ENCOUNTER — Ambulatory Visit: Payer: Self-pay | Admitting: Internal Medicine

## 2021-11-17 NOTE — Progress Notes (Deleted)
Name: Chris Harrington  Age/ Sex: 46 y.o., male   MRN/ DOB: [id number], 06/12/1975     PCP: Philip Aspen, Limmie Patricia, MD   Reason for Endocrinology Evaluation: Type 2 Diabetes Mellitus  Initial Endocrine Consultative Visit: 02/19/2020    PATIENT IDENTIFIER: Chris Harrington is a 46 y.o. male with a past medical history of DM and HTN. The patient has followed with Endocrinology clinic since 02/19/2020 for consultative assistance with management of his diabetes.  DIABETIC HISTORY:  Chris Harrington was diagnosed with DM in 01/2020, he was hospitalized with a serum  glucose of 624 mg/dL but no DKA.He was discharged on insulin and Metformin but was unable to swallow the Metformin pills and we switched it to Trulicity.  His hemoglobin A1c was 13.1% upon presentation.     HTN History:  Due to hx of HTn with hypokalemia, we checked aldo: renin , which was normal as well as normal  Dexamethasone suppression test at 0.6 ug/dL. 02/2020   DYSLIPIDEMIA HISTORY:  LDL above goal at 122 mg/dL. We started Atorvastatin in 05/2020  SUBJECTIVE:   During the last visit (05/12/2021): A1c 5.5 % . We continued   Trulicity     Today (11/17/2021): Chris Harrington is here for a follow up on diabetes management. He checks his blood sugars 3 times weekly.The patient has not  had hypoglycemic episodes since the last clinic visit.   Denies nausea or vomiting  or diarrhea  He has been congested for the past 4 months and using nasal spray  Denies cough   HOME DIABETES REGIMEN:  Trulicity 1.5 mg weekly ( Monday )        Statin: no ACE-I/ARB: no    METER DOWNLOAD SUMMARY: Did not bring     DIABETIC COMPLICATIONS: Microvascular complications:   Denies: CKD, neuropathy, retinopathy Last Eye Exam: Completed 02/2020  Macrovascular complications:   Denies: CAD, CVA, PVD   HISTORY:  Past Medical History:  Past Medical History:  Diagnosis Date   Hypertension    Obesity    Past Surgical History:  Past  Surgical History:  Procedure Laterality Date   TONSILECTOMY, ADENOIDECTOMY, BILATERAL MYRINGOTOMY AND TUBES     pt has not had tubes in ears   Social History:  reports that he has never smoked. He has never used smokeless tobacco. He reports current alcohol use. He reports that he does not use drugs. Family History:  Family History  Problem Relation Age of Onset   Hypertension Mother    Diabetes Mother    Heart disease Mother    Stroke Mother    Hypertension Father    Cancer Paternal Grandmother        passed from unknown cancer   Hypertension Maternal Grandmother    Hypertension Maternal Grandfather      HOME MEDICATIONS: Allergies as of 11/17/2021       Reactions   Cranberry Swelling        Medication List        Accurate as of November 17, 2021  7:29 AM. If you have any questions, ask your nurse or doctor.          Accu-Chek Softclix Lancets lancets Use once daily for glucose control with Accu Chek Aviva. DxE11.9.   accu-chek soft touch lancets Use once daily for glucose control with Accu Check Aviva  DxE11.9.   amLODipine 10 MG tablet Commonly known as: NORVASC TAKE 1 TABLET(10 MG) BY MOUTH DAILY   atorvastatin 10 MG tablet Commonly known as:  LIPITOR Take 1 tablet (10 mg total) by mouth daily.   diphenhydrAMINE 50 MG capsule Commonly known as: BENADRYL Take 50 mg by mouth every 6 (six) hours as needed.   EPINEPHrine 0.3 mg/0.3 mL Soaj injection Commonly known as: EPI-PEN Inject 0.3 mg into the muscle as needed for anaphylaxis.   famotidine 20 MG tablet Commonly known as: PEPCID Take 1 tablet (20 mg total) by mouth 2 (two) times daily.   fluticasone 50 MCG/ACT nasal spray Commonly known as: FLONASE Place 1 spray into both nostrils daily.   Trulicity 1.5 MG/0.5ML Sopn Generic drug: Dulaglutide Inject 1.5 mg into the skin once a week.         OBJECTIVE:   Vital Signs: There were no vitals taken for this visit.  Wt Readings from Last 3  Encounters:  05/12/21 210 lb (95.3 kg)  09/23/20 196 lb 9.6 oz (89.2 kg)  05/20/20 193 lb 2 oz (87.6 kg)     Exam: General: Pt appears well and is in NAD Nasal turbinates swollen and erythematous with the left nostril ulceration  Lungs: Clear with good BS bilat with no rales, rhonchi, or wheezes  Heart: RRR   Abdomen: Normoactive bowel sounds, soft, nontender, without masses or organomegaly palpable  Extremities: No pretibial edema.  Neuro: MS is good with appropriate affect, pt is alert and Ox3     DM foot exam: 05/12/2020   The skin of the feet is intact without sores or ulcerations. The pedal pulses are 2+ on right and 2+ on left. The sensation is intact to a screening 5.07, 10 gram monofilament bilaterally    DATA REVIEWED:  Lab Results  Component Value Date   HGBA1C 5.5 05/12/2021   HGBA1C 5.3 09/23/2020   HGBA1C 6.8 (A) 05/20/2020       ASSESSMENT / PLAN / RECOMMENDATIONS:   1) Type 2 Diabetes Mellitus, Optimally controlled, Without  complications - Most recent A1c of 5.5 %. Goal A1c < 7.0 %.    - Praised the pt on improved glycemic control  -I have offered reducing his Trulicity but he is comfortable with the current regimen - Unable to swallow large pills , hence he was unable to try metformin  MEDICATIONS: -Continue Trulicity 1.5  mg weekly     EDUCATION / INSTRUCTIONS: BG monitoring instructions: Patient is instructed to check his blood sugars 1 times a day. Call [facility] if: BG persistently < 70  I reviewed the Rule of 15 for the treatment of hypoglycemia in detail with the patient. Literature supplied.    2) Diabetic complications:  Eye: Does not have known diabetic retinopathy.  Neuro/ Feet: Does not have known diabetic peripheral neuropathy .  Renal: Patient does not have known baseline CKD. He   is not on an ACEI/ARB at present.    3) Dyslipidemia :    -His LDL is above goal, I have attempted multiple times to start him  on statin therapy but he declines -He understands the cardiovascular benefits to statins      Follow-up in 6 months     Signed electronically by: Lyndle Herrlich, MD  [facility]  [facility] [address] [place] [place], [place] [address] Phone: [phone] FAX: [phone]   CC: Philip Aspen, Limmie Patricia, MD [address] [place] [address] Phone: [phone]  Fax: [phone]  Return to Endocrinology clinic as below: Future Appointments  Date Time Provider Department Center  11/17/2021  1:00 PM Adalind Weitz, Brent Bulla  Gust Brooms, MD [facility] None  01/19/2022 10:00 AM Philip Aspen, Limmie Patricia, MD [facility]

## 2022-01-19 ENCOUNTER — Encounter: Payer: Self-pay | Admitting: Internal Medicine

## 2022-01-19 ENCOUNTER — Ambulatory Visit (INDEPENDENT_AMBULATORY_CARE_PROVIDER_SITE_OTHER): Payer: Self-pay | Admitting: Internal Medicine

## 2022-01-19 VITALS — BP 137/84 | HR 70 | Temp 98.4°F | Ht 63.0 in | Wt 210.1 lb

## 2022-01-19 DIAGNOSIS — Z0000 Encounter for general adult medical examination without abnormal findings: Secondary | ICD-10-CM

## 2022-01-19 DIAGNOSIS — E785 Hyperlipidemia, unspecified: Secondary | ICD-10-CM

## 2022-01-19 DIAGNOSIS — E119 Type 2 diabetes mellitus without complications: Secondary | ICD-10-CM

## 2022-01-19 DIAGNOSIS — I10 Essential (primary) hypertension: Secondary | ICD-10-CM

## 2022-01-19 LAB — VITAMIN B12: Vitamin B-12: 250 pg/mL (ref 211–911)

## 2022-01-19 LAB — COMPREHENSIVE METABOLIC PANEL
ALT: 16 U/L (ref 0–53)
AST: 18 U/L (ref 0–37)
Albumin: 4 g/dL (ref 3.5–5.2)
Alkaline Phosphatase: 75 U/L (ref 39–117)
BUN: 17 mg/dL (ref 6–23)
CO2: 30 mEq/L (ref 19–32)
Calcium: 9.5 mg/dL (ref 8.4–10.5)
Chloride: 103 mEq/L (ref 96–112)
Creatinine, Ser: 1.31 mg/dL (ref 0.40–1.50)
GFR: 66.38 mL/min (ref >60.00)
Glucose, Bld: 91 mg/dL (ref 70–99)
Potassium: 3.5 mEq/L (ref 3.5–5.1)
Sodium: 140 mEq/L (ref 135–145)
Total Bilirubin: 0.5 mg/dL (ref 0.2–1.2)
Total Protein: 7.7 g/dL (ref 6.0–8.3)

## 2022-01-19 LAB — CBC WITH DIFFERENTIAL/PLATELET
Basophils Absolute: 0.1 10*3/uL (ref 0.0–0.1)
Basophils Relative: 1.2 % (ref 0.0–3.0)
Eosinophils Absolute: 0.3 10*3/uL (ref 0.0–0.7)
Eosinophils Relative: 4.6 % (ref 0.0–5.0)
HCT: 41.9 % (ref 39.0–52.0)
Hemoglobin: 14 g/dL (ref 13.0–17.0)
Lymphocytes Relative: 34.1 % (ref 12.0–46.0)
Lymphs Abs: 1.9 10*3/uL (ref 0.7–4.0)
MCHC: 33.4 g/dL (ref 30.0–36.0)
MCV: 86.1 fl (ref 78.0–100.0)
Monocytes Absolute: 0.6 10*3/uL (ref 0.1–1.0)
Monocytes Relative: 10.7 % (ref 3.0–12.0)
Neutro Abs: 2.7 10*3/uL (ref 1.4–7.7)
Neutrophils Relative %: 49.4 % (ref 43.0–77.0)
Platelets: 296 10*3/uL (ref 150.0–400.0)
RBC: 4.87 Mil/uL (ref 4.22–5.81)
RDW: 14.3 % (ref 11.5–15.5)
WBC: 5.5 10*3/uL (ref 4.0–10.5)

## 2022-01-19 LAB — LIPID PANEL
Cholesterol: 161 mg/dL (ref 0–200)
HDL: 34.6 mg/dL — ABNORMAL LOW (ref >39.00)
LDL Cholesterol: 96 mg/dL (ref 0–99)
NonHDL: 126.63
Total CHOL/HDL Ratio: 5
Triglycerides: 153 mg/dL — ABNORMAL HIGH (ref 0.0–149.0)
VLDL: 30.6 mg/dL (ref 0.0–40.0)

## 2022-01-19 LAB — PSA: PSA: 0.91 ng/mL (ref 0.10–4.00)

## 2022-01-19 LAB — HEMOGLOBIN A1C: Hgb A1c MFr Bld: 6.3 % (ref 4.6–6.5)

## 2022-01-19 LAB — VITAMIN D 25 HYDROXY (VIT D DEFICIENCY, FRACTURES): VITD: 8.19 ng/mL — ABNORMAL LOW (ref 30.00–100.00)

## 2022-01-19 LAB — TSH: TSH: 2.49 u[IU]/mL (ref 0.35–5.50)

## 2022-01-19 NOTE — Progress Notes (Signed)
Established Patient Office Visit     CC/Reason for Visit: Annual preventive exam  HPI: Chris Harrington is a 46 y.o. male who is coming in today for the above mentioned reasons. Past Medical History is significant for: HTN, DM 2, vit D def and obesity. I have not seen him in 2 years. His last visit with endocrinology was in Feb.He states he was advised to stop all DM medication as his A1c was 5.5. However, there is no indication of this in their note. He has no complaints. He is due for flu, COVID, Tdap vaccines.  He is overdue for diabetic eye exam.  He states he is compliant with blood pressure medication.  He never started statin prescribed by endocrinology.   Past Medical/Surgical History: Past Medical History:  Diagnosis Date   Hypertension    Obesity     Past Surgical History:  Procedure Laterality Date   TONSILECTOMY, ADENOIDECTOMY, BILATERAL MYRINGOTOMY AND TUBES     pt has not had tubes in ears    Social History:  reports that he has never smoked. He has never used smokeless tobacco. He reports current alcohol use. He reports that he does not use drugs.  Allergies: Allergies  Allergen Reactions   Cranberry Swelling    Family History:  Family History  Problem Relation Age of Onset   Hypertension Mother    Diabetes Mother    Heart disease Mother    Stroke Mother    Hypertension Father    Cancer Paternal Grandmother        passed from unknown cancer   Hypertension Maternal Grandmother    Hypertension Maternal Grandfather      Current Outpatient Medications:    Accu-Chek Softclix Lancets lancets, Use once daily for glucose control with Accu Chek Aviva. DxE11.9., Disp: 100 each, Rfl: 12   amLODipine (NORVASC) 10 MG tablet, TAKE 1 TABLET(10 MG) BY MOUTH DAILY, Disp: 90 tablet, Rfl: 0  Review of Systems:  Constitutional: Denies fever, chills, diaphoresis, appetite change and fatigue.  HEENT: Denies photophobia, eye pain, redness, hearing loss, ear pain,  congestion, sore throat, rhinorrhea, sneezing, mouth sores, trouble swallowing, neck pain, neck stiffness and tinnitus.   Respiratory: Denies SOB, DOE, cough, chest tightness,  and wheezing.   Cardiovascular: Denies chest pain, palpitations and leg swelling.  Gastrointestinal: Denies nausea, vomiting, abdominal pain, diarrhea, constipation, blood in stool and abdominal distention.  Genitourinary: Denies dysuria, urgency, frequency, hematuria, flank pain and difficulty urinating.  Endocrine: Denies: hot or cold intolerance, sweats, changes in hair or nails, polyuria, polydipsia. Musculoskeletal: Denies myalgias, back pain, joint swelling, arthralgias and gait problem.  Skin: Denies pallor, rash and wound.  Neurological: Denies dizziness, seizures, syncope, weakness, light-headedness, numbness and headaches.  Hematological: Denies adenopathy. Easy bruising, personal or family bleeding history  Psychiatric/Behavioral: Denies suicidal ideation, mood changes, confusion, nervousness, sleep disturbance and agitation    Physical Exam: Vitals:   01/19/22 1006 01/19/22 1009  BP: (!) 150/90 137/84  Pulse: 70   Temp: 98.4 F (36.9 C)   TempSrc: Oral   SpO2: 98%   Weight: 210 lb 1.6 oz (95.3 kg)   Height: 5\' 3"  (1.6 m)     Body mass index is 37.22 kg/m.   Constitutional: NAD, calm, comfortable Eyes: PERRL, lids and conjunctivae normal ENMT: Mucous membranes are moist. Posterior pharynx clear of any exudate or lesions. Normal dentition. Tympanic membrane is pearly white, no erythema or bulging. Neck: normal, supple, no masses, no thyromegaly Respiratory: clear to auscultation  bilaterally, no wheezing, no crackles. Normal respiratory effort. No accessory muscle use.  Cardiovascular: Regular rate and rhythm, no murmurs / rubs / gallops. No extremity edema. 2+ pedal pulses. No carotid bruits.  Abdomen: no tenderness, no masses palpated. No hepatosplenomegaly. Bowel sounds positive.   Musculoskeletal: no clubbing / cyanosis. No joint deformity upper and lower extremities. Good ROM, no contractures. Normal muscle tone.  Skin: no rashes, lesions, ulcers. No induration Neurologic: CN 2-12 grossly intact. Sensation intact, DTR normal. Strength 5/5 in all 4.  Psychiatric: Normal judgment and insight. Alert and oriented x 3. Normal mood.     Impression and Plan:  Encounter for preventive health examination - Plan: PSA  Dyslipidemia - Plan: Lipid panel  Primary hypertension - Plan: CBC with Differential/Platelet, Comprehensive metabolic panel  Type 2 diabetes mellitus without complication, without long-term current use of insulin (HCC) - Plan: Hemoglobin A1c, TSH, Vitamin B12, VITAMIN D 25 Hydroxy (Vit-D Deficiency, Fractures)   -Recommend routine eye and dental care. -Immunizations:declines all vaccines today despite counseling. -Healthy lifestyle discussed in detail. -Labs to be updated today. -Colon cancer screening: commence age 44 -Breast cancer screening: N/A -Cervical cancer screening: N/A -Lung cancer screening: N/A -Prostate cancer screening: PSA today -DEXA: N/A  -Advised he should follow up with endocrinology.    Lelon Frohlich, MD [facility]

## 2022-01-20 ENCOUNTER — Other Ambulatory Visit: Payer: Self-pay | Admitting: *Deleted

## 2022-01-20 ENCOUNTER — Other Ambulatory Visit: Payer: Self-pay | Admitting: Internal Medicine

## 2022-01-20 ENCOUNTER — Encounter: Payer: Self-pay | Admitting: Internal Medicine

## 2022-01-20 DIAGNOSIS — E559 Vitamin D deficiency, unspecified: Secondary | ICD-10-CM

## 2022-01-20 DIAGNOSIS — E785 Hyperlipidemia, unspecified: Secondary | ICD-10-CM

## 2022-01-20 MED ORDER — VITAMIN D (ERGOCALCIFEROL) 1.25 MG (50000 UNIT) PO CAPS: 50000.0000 [IU] | ORAL_CAPSULE | ORAL | 0 refills | Status: AC

## 2022-01-20 MED ORDER — ATORVASTATIN CALCIUM 10 MG PO TABS: 10.0000 mg | ORAL_TABLET | Freq: Every day | ORAL | 3 refills | Status: DC

## 2022-03-10 ENCOUNTER — Other Ambulatory Visit: Payer: Self-pay

## 2022-03-10 ENCOUNTER — Emergency Department (HOSPITAL_BASED_OUTPATIENT_CLINIC_OR_DEPARTMENT_OTHER)
Admission: EM | Admit: 2022-03-10 | Discharge: 2022-03-10 | Disposition: A | Discharge status: Home / Self Care | Payer: Commercial Managed Care - HMO | Attending: Emergency Medicine | Admitting: Emergency Medicine

## 2022-03-10 ENCOUNTER — Emergency Department (HOSPITAL_BASED_OUTPATIENT_CLINIC_OR_DEPARTMENT_OTHER): Payer: Commercial Managed Care - HMO

## 2022-03-10 DIAGNOSIS — Z79899 Other long term (current) drug therapy: Secondary | ICD-10-CM | POA: Insufficient documentation

## 2022-03-10 DIAGNOSIS — I10 Essential (primary) hypertension: Secondary | ICD-10-CM | POA: Diagnosis not present

## 2022-03-10 DIAGNOSIS — E119 Type 2 diabetes mellitus without complications: Secondary | ICD-10-CM | POA: Diagnosis not present

## 2022-03-10 DIAGNOSIS — R0789 Other chest pain: Secondary | ICD-10-CM | POA: Diagnosis present

## 2022-03-10 DIAGNOSIS — R079 Chest pain, unspecified: Secondary | ICD-10-CM

## 2022-03-10 DIAGNOSIS — E876 Hypokalemia: Secondary | ICD-10-CM

## 2022-03-10 LAB — CBC
HCT: 41.4 % (ref 39.0–52.0)
Hemoglobin: 13.9 g/dL (ref 13.0–17.0)
MCH: 28.7 pg (ref 26.0–34.0)
MCHC: 33.6 g/dL (ref 30.0–36.0)
MCV: 85.4 fL (ref 80.0–100.0)
Platelets: 308 10*3/uL (ref 150–400)
RBC: 4.85 MIL/uL (ref 4.22–5.81)
RDW: 14 % (ref 11.5–15.5)
WBC: 4.8 10*3/uL (ref 4.0–10.5)
nRBC: 0 % (ref 0.0–0.2)

## 2022-03-10 LAB — BASIC METABOLIC PANEL
Anion gap: 6 (ref 5–15)
BUN: 15 mg/dL (ref 6–20)
CO2: 25 mmol/L (ref 22–32)
Calcium: 8.5 mg/dL — ABNORMAL LOW (ref 8.9–10.3)
Chloride: 106 mmol/L (ref 98–111)
Creatinine, Ser: 1.37 mg/dL — ABNORMAL HIGH (ref 0.61–1.24)
GFR, Estimated: >60 mL/min (ref >60)
Glucose, Bld: 123 mg/dL — ABNORMAL HIGH (ref 70–99)
Potassium: 2.8 mmol/L — ABNORMAL LOW (ref 3.5–5.1)
Sodium: 137 mmol/L (ref 135–145)

## 2022-03-10 LAB — TROPONIN I (HIGH SENSITIVITY)
Troponin I (High Sensitivity): 8 ng/L (ref <18)
Troponin I (High Sensitivity): 8 ng/L (ref <18)

## 2022-03-10 LAB — D-DIMER, QUANTITATIVE: D-Dimer, Quant: 0.32 ug/mL-FEU (ref 0.00–0.50)

## 2022-03-10 MED ORDER — POTASSIUM CHLORIDE CRYS ER 20 MEQ PO TBCR
40.0000 meq | EXTENDED_RELEASE_TABLET | Freq: Once | ORAL | Status: AC
  Administered 2022-03-10: 40 meq via ORAL
  Filled 2022-03-10: qty 2

## 2022-03-10 NOTE — ED Triage Notes (Signed)
Mid chest pressure worse with inhalation and movement . Pain started last nigh tat work . Denies shortness of breath or cough .  Left hand numbness started today .  Denies Hx anxiety .

## 2022-03-10 NOTE — ED Provider Notes (Signed)
[facility] EMERGENCY DEPARTMENT Provider Note   CSN: [id number] Arrival date & time: 03/10/22  1413     History  Chief Complaint  Patient presents with   Chest Pain    Makaveli Hoard is a 46 y.o. male.  The history is provided by the patient. No language interpreter was used.  Chest Pain Pain location:  Substernal area and L chest Pain quality: aching   Pain radiates to:  Does not radiate Pain severity:  Moderate Onset quality:  Gradual Duration:  2 days Timing:  Intermittent Progression:  Waxing and waning Chronicity:  New Relieved by:  Nothing Worsened by:  Movement and deep breathing Ineffective treatments:  None tried Associated symptoms: no abdominal pain, no back pain, no cough, no diaphoresis, no dizziness, no fatigue, no fever, no headache, no lower extremity edema, no nausea, no near-syncope, no palpitations, no shortness of breath, no vomiting and no weakness  Numbness: mild tinglin reported. Risk factors: hypertension and male sex   Risk factors: no coronary artery disease, no prior DVT/PE and no smoking        Home Medications Prior to Admission medications   Medication Sig Start Date End Date Taking? Authorizing Provider  Accu-Chek Softclix Lancets lancets Use once daily for glucose control with Accu Chek Aviva. DxE11.9. 02/01/20   Philip Aspen, Limmie Patricia, MD  amLODipine (NORVASC) 10 MG tablet TAKE 1 TABLET(10 MG) BY MOUTH DAILY 10/27/21   Philip Aspen, Limmie Patricia, MD  atorvastatin (LIPITOR) 10 MG tablet Take 1 tablet (10 mg total) by mouth daily. 01/20/22   Philip Aspen, Limmie Patricia, MD  Vitamin D, Ergocalciferol, (DRISDOL) 1.25 MG (50000 UNIT) CAPS capsule Take 1 capsule (50,000 Units total) by mouth every 7 (seven) days for 12 doses. 01/20/22 04/08/22  Philip Aspen, Limmie Patricia, MD  potassium chloride SA (KLOR-CON) 20 MEQ tablet Take 1 tablet (20 mEq total) by mouth daily for 21 days. 03/25/19 07/16/19  Terald Sleeper, MD      Allergies     Cranberry    Review of Systems   Review of Systems  Constitutional:  Negative for chills, diaphoresis, fatigue and fever.  HENT:  Negative for congestion.   Respiratory:  Negative for cough, chest tightness, shortness of breath and wheezing.   Cardiovascular:  Positive for chest pain. Negative for palpitations, leg swelling and near-syncope.  Gastrointestinal:  Negative for abdominal pain, constipation, diarrhea, nausea and vomiting.  Genitourinary:  Negative for dysuria, flank pain and frequency.  Musculoskeletal:  Negative for back pain, neck pain and neck stiffness.  Skin:  Negative for rash and wound.  Neurological:  Negative for dizziness, weakness, light-headedness and headaches. Numbness: mild tinglin reported. Psychiatric/Behavioral:  Negative for agitation.   All other systems reviewed and are negative.   Physical Exam Updated Vital Signs BP (!) 156/92 (BP Location: Right Arm)   Pulse 81   Temp 98.1 F (36.7 C) (Oral)   Resp 16   Ht 5' (1.524 m)   Wt 90.7 kg   SpO2 99%   BMI 39.06 kg/m  Physical Exam Vitals and nursing note reviewed.  Constitutional:      General: He is not in acute distress.    Appearance: He is well-developed. He is not ill-appearing, toxic-appearing or diaphoretic.  HENT:     Head: Normocephalic and atraumatic.     Nose: Nose normal.     Mouth/Throat:     Mouth: Mucous membranes are moist.  Eyes:     Extraocular Movements: Extraocular movements  intact.     Conjunctiva/sclera: Conjunctivae normal.     Pupils: Pupils are equal, round, and reactive to light.  Cardiovascular:     Rate and Rhythm: Normal rate and regular rhythm.     Heart sounds: Normal heart sounds. No murmur heard. Pulmonary:     Effort: Pulmonary effort is normal. No respiratory distress.     Breath sounds: Normal breath sounds. No stridor. No wheezing, rhonchi or rales.  Chest:     Chest wall: Tenderness present.  Abdominal:     Palpations: Abdomen is soft.      Tenderness: There is no abdominal tenderness. There is no right CVA tenderness, left CVA tenderness, guarding or rebound.  Musculoskeletal:        General: No swelling.     Cervical back: Neck supple.     Right lower leg: No tenderness. No edema.     Left lower leg: No tenderness. No edema.  Skin:    General: Skin is warm and dry.     Capillary Refill: Capillary refill takes less than 2 seconds.     Coloration: Skin is not pale.     Findings: No erythema.  Neurological:     General: No focal deficit present.     Mental Status: He is alert and oriented to person, place, and time.     Sensory: No sensory deficit.     Motor: No weakness.     Coordination: Coordination normal.  Psychiatric:        Mood and Affect: Mood normal.     ED Results / Procedures / Treatments   Labs (all labs ordered are listed, but only abnormal results are displayed) Labs Reviewed  BASIC METABOLIC PANEL - Abnormal; Notable for the following components:      Result Value   Potassium 2.8 (*)    Glucose, Bld 123 (*)    Creatinine, Ser 1.37 (*)    Calcium 8.5 (*)    All other components within normal limits  CBC  D-DIMER, QUANTITATIVE  TROPONIN I (HIGH SENSITIVITY)  TROPONIN I (HIGH SENSITIVITY)    EKG EKG Interpretation  Date/Time:  Wednesday March 10 2022 14:23:17 EST Ventricular Rate:  74 PR Interval:  164 QRS Duration: 84 QT Interval:  392 QTC Calculation: 435 R Axis:   44 Text Interpretation: Normal sinus rhythm Possible Anterior infarct , age undetermined Abnormal ECG When compared with ECG of 24-Mar-2019 13:04, PREVIOUS ECG IS PRESENT when compared to prior, similar appearance with less PVC. No STEMI Confirmed by Theda Belfastegeler, Chris ([id number]) on 03/10/2022 4:04:31 PM  Radiology DG Chest 2 View  Result Date: 03/10/2022 CLINICAL DATA:  Chest pain EXAM: CHEST - 2 VIEW COMPARISON:  03/06/2018 FINDINGS: Cardiomegaly. Both lungs are clear. The visualized skeletal structures are unremarkable.  IMPRESSION: Cardiomegaly without acute abnormality of the lungs. Electronically Signed   By: Jearld LeschAlex D Bibbey M.D.   On: 03/10/2022 14:37    Procedures Procedures    Medications Ordered in ED Medications  potassium chloride SA (KLOR-CON M) CR tablet 40 mEq (40 mEq Oral Given 03/10/22 1955)      ED Course/ Medical Decision Making/ A&P                           Medical Decision Making Amount and/or Complexity of Data Reviewed Labs: ordered. Radiology: ordered.  Risk Prescription drug management.    Gelene Minkravis Ellinger is a 46 y.o. male with a past medical history significant for hypertension, dyslipidemia,  and diabetes who presents with pleuritic chest discomfort since yesterday.  According to patient, he was at work yesterday and started having some pain in his left central chest that felt worse with twisting, moving, and deep breathing.  He reports it is not exertional but is the same waxing and waning.  He reports he has not had any fevers, chills, or cough and has no URI symptoms.  He denies any trauma and denies any rashes to suggest shingles.  He has never had this kind of pain before.  He reports that he thought his left arm tingles slightly and that prompted him to seek evaluation to rule out a cardiac cause of symptoms.  He reports that he does do movement with his arms and lifting at work but does not recall a single episode where he had an onset of the pain from trauma.  He denies any sick contacts.  Denies any diaphoresis, nausea, vomiting, leg pain, leg swelling, or recent travel.  Denies any history of cardiac disease or thromboembolic disease.  Denies other complaints.  Reports the pain was more severe and is now improved.  On exam, lungs clear.  Chest is quite tender to palpation and there is no rash.  No murmur.  Good pulses in extremities.  Intact sensation and strength in extremities.  Legs are nontender and nonedematous.  Back nontender.  Patient well-appearing with elevated blood  pressures otherwise reassuring exam.  EKG does not show STEMI but does show possible S1Q3T3 pattern.  Given the patient's pleuritic discomfort, EKG findings of the pattern, and the description of symptoms, we will get a D-dimer on him.  Will get delta troponin.  Initial troponin is normal.  Chest x-ray shows some cardiomegaly but otherwise he is denying any shortness of breath leg pain or leg swelling so suspicion for acute heart failure exacerbation at this time.  We discussed the x-ray findings and that he will likely need follow-up with a PCP.  Pain is not stabbing it does not shoot to his back.  Low suspicion for an aortic cause at this time.  Otherwise labs have returned reassuring thus far.  Patient is amenable to getting a second troponin and a D-dimer and if this is reassuring, anticipate this is more likely chest wall discomfort and is likely appropriate for outpatient PCP/cardiology follow-up with medication to help for possible muscle spasm and pain on the chest wall.  Anticipate reassessment after workup is completed.          10:55 PM Workup continues to return otherwise reassuring.  Second opponent negative and D-dimer negative.  Given his tenderness on the chest wall I suspect this is chest wall discomfort and musculoskeletal pain.  Will give prescription for Lidoderm patches and Flexeril however with the cardiomegaly seen on x-ray, we will have him follow-up with PCP and cardiology.  Patient agrees with this plan.  He noted questions or concerns and was discharged in good condition.          Final Clinical Impression(s) / ED Diagnoses Final diagnoses:  Chest pain, unspecified type  Atypical chest pain  Chest wall pain  Hypokalemia    Rx / DC Orders ED Discharge Orders     None       Clinical Impression: 1. Chest pain, unspecified type   2. Atypical chest pain   3. Chest wall pain   4. Hypokalemia     Disposition: Discharge  Condition: Good  I have  discussed the results, Dx and Tx  plan with the pt(& family if present). He/she/they expressed understanding and agree(s) with the plan. Discharge instructions discussed at great length. Strict return precautions discussed and pt &/or family have verbalized understanding of the instructions. No further questions at time of discharge.    New Prescriptions   No medications on file    Follow Up: Philip Aspen, Limmie Patricia, MD [address] [place] [address] [phone]     [facility] A DEPT OF [place]. [facility] [address] [place] [address] [phone]   \   Kyri Dai, Canary Brim, MD 03/10/22 [phone]

## 2022-03-10 NOTE — ED Notes (Signed)
D/c paperwork reviewed with pt incl f/u care and emphasis on f/u regarding elevated BP.  Pt verbalized understanding, no questions or concerns at time of d/c. Ambulatory to ED exit without assistance, NAD.

## 2022-03-10 NOTE — Discharge Instructions (Signed)
Your history, exam, and workup today were overall reassuring.  The heart enzymes were negative both times we checked them and the blood clot test was negative.  Your chest x-ray did show some mild cardiomegaly so please follow-up with cardiology and your primary doctor.  I suspect your discomfort was in the chest wall given the discomfort with palpation and your otherwise reassuring workup.  We do feel you are safe for discharge home.  If any symptoms change or worsen acutely, please return to the nearest emergency department.

## 2022-03-11 ENCOUNTER — Telehealth: Payer: Self-pay

## 2022-03-11 NOTE — Telephone Encounter (Signed)
Transition Care Management Follow-up Telephone Call Date of discharge and from where: 03/10/22 from [facility] ED for Chest pain, unspecified How have you been since you were released from the hospital? Pt states he's doing better.  Any questions or concerns? No  Items Reviewed: Did the pt receive and understand the discharge instructions provided? Yes  Medications obtained and verified? Yes  Other? No  Any new allergies since your discharge? No   Follow up appointments reviewed:  PCP Hospital f/u appt confirmed? Yes  Scheduled to see Dr Ardyth Harps on 03/16/22 @ 1130. Specialist Hospital f/u appt confirmed? No  Pt encouraged to call [facility] number on his d/c instructions ASAP to schedule appt. Pt verb understanding. Are transportation arrangements needed? No  If their condition worsens, is the pt aware to call PCP or go to the Emergency Dept.? Yes Was the patient provided with contact information for the PCP's office or ED? Yes Was to pt encouraged to call back with questions or concerns? Yes

## 2022-03-16 ENCOUNTER — Ambulatory Visit (INDEPENDENT_AMBULATORY_CARE_PROVIDER_SITE_OTHER): Payer: Commercial Managed Care - HMO | Admitting: Internal Medicine

## 2022-03-16 VITALS — BP 149/109 | Temp 98.0°F | Wt 207.2 lb

## 2022-03-16 DIAGNOSIS — I517 Cardiomegaly: Secondary | ICD-10-CM | POA: Diagnosis not present

## 2022-03-16 DIAGNOSIS — Z09 Encounter for follow-up examination after completed treatment for conditions other than malignant neoplasm: Secondary | ICD-10-CM

## 2022-03-16 DIAGNOSIS — I10 Essential (primary) hypertension: Secondary | ICD-10-CM

## 2022-03-16 MED ORDER — VALSARTAN-HYDROCHLOROTHIAZIDE 160-25 MG PO TABS: 1.0000 | ORAL_TABLET | Freq: Every day | ORAL | 1 refills | Status: DC

## 2022-03-16 NOTE — Progress Notes (Signed)
Established Patient Office Visit     CC/Reason for Visit: ED follow-up, high blood pressure  HPI: Chris Harrington is a 46 y.o. male who is coming in today for the above mentioned reasons. Past Medical History is significant for: Hypertension, type 2 diabetes, vitamin D deficiency, obesity.  I saw him for the first time in October.  He has been on amlodipine 10 mg.  He ended up in the emergency department on December 6 with chest pain.  Workup was reassuring, however he was found to be hypertensive to 150/90 and on chest x-ray was found to have cardiomegaly.  Asked to follow-up with me.   Past Medical/Surgical History: Past Medical History:  Diagnosis Date   Hypertension    Obesity     Past Surgical History:  Procedure Laterality Date   TONSILECTOMY, ADENOIDECTOMY, BILATERAL MYRINGOTOMY AND TUBES     pt has not had tubes in ears    Social History:  reports that he has never smoked. He has never used smokeless tobacco. He reports current alcohol use. He reports that he does not use drugs.  Allergies: Allergies  Allergen Reactions   Cranberry Swelling    Family History:  Family History  Problem Relation Age of Onset   Hypertension Mother    Diabetes Mother    Heart disease Mother    Stroke Mother    Hypertension Father    Cancer Paternal Grandmother        passed from unknown cancer   Hypertension Maternal Grandmother    Hypertension Maternal Grandfather      Current Outpatient Medications:    Accu-Chek Softclix Lancets lancets, Use once daily for glucose control with Accu Chek Aviva. DxE11.9., Disp: 100 each, Rfl: 12   amLODipine (NORVASC) 10 MG tablet, TAKE 1 TABLET(10 MG) BY MOUTH DAILY (Patient taking differently: in the morning and at bedtime. TAKE 1 TABLET(10 MG) BY MOUTH DAILY), Disp: 90 tablet, Rfl: 0   atorvastatin (LIPITOR) 10 MG tablet, Take 1 tablet (10 mg total) by mouth daily., Disp: 90 tablet, Rfl: 3   valsartan-hydrochlorothiazide (DIOVAN-HCT)  160-25 MG tablet, Take 1 tablet by mouth daily., Disp: 90 tablet, Rfl: 1   Vitamin D, Ergocalciferol, (DRISDOL) 1.25 MG (50000 UNIT) CAPS capsule, Take 1 capsule (50,000 Units total) by mouth every 7 (seven) days for 12 doses., Disp: 12 capsule, Rfl: 0  Review of Systems:  Constitutional: Denies fever, chills, diaphoresis, appetite change and fatigue.  HEENT: Denies photophobia, eye pain, redness, hearing loss, ear pain, congestion, sore throat, rhinorrhea, sneezing, mouth sores, trouble swallowing, neck pain, neck stiffness and tinnitus.   Respiratory: Denies SOB, DOE, cough, chest tightness,  and wheezing.   Cardiovascular: Denies chest pain, palpitations and leg swelling.  Gastrointestinal: Denies nausea, vomiting, abdominal pain, diarrhea, constipation, blood in stool and abdominal distention.  Genitourinary: Denies dysuria, urgency, frequency, hematuria, flank pain and difficulty urinating.  Endocrine: Denies: hot or cold intolerance, sweats, changes in hair or nails, polyuria, polydipsia. Musculoskeletal: Denies myalgias, back pain, joint swelling, arthralgias and gait problem.  Skin: Denies pallor, rash and wound.  Neurological: Denies dizziness, seizures, syncope, weakness, light-headedness, numbness and headaches.  Hematological: Denies adenopathy. Easy bruising, personal or family bleeding history  Psychiatric/Behavioral: Denies suicidal ideation, mood changes, confusion, nervousness, sleep disturbance and agitation    Physical Exam: Vitals:   03/16/22 1133 03/16/22 1138  BP: (!) 140/90 (!) 149/109  Temp: 98 F (36.7 C)   TempSrc: Oral   Weight: 207 lb 3.2 oz (94 kg)  Body mass index is 40.47 kg/m.   Constitutional: NAD, calm, comfortable Eyes: PERRL, lids and conjunctivae normal ENMT: Mucous membranes are moist.  Respiratory: clear to auscultation bilaterally, no wheezing, no crackles. Normal respiratory effort. No accessory muscle use.  Cardiovascular: Regular rate  and rhythm, no murmurs / rubs / gallops. No extremity edema.   Psychiatric: Normal judgment and insight. Alert and oriented x 3. Normal mood.    Impression and Plan:  Hospital discharge follow-up  Primary hypertension - Plan: valsartan-hydrochlorothiazide (DIOVAN-HCT) 160-25 MG tablet  Cardiomegaly - Plan: ECHOCARDIOGRAM COMPLETE  -ED records have been reviewed in detail. -Chest pain workup was negative, however on chest x-ray he was noted to have cardiomegaly and was hypertensive as well.  Asked to follow-up with me. -Add Diovan HCT to amlodipine, check 2D echocardiogram. -Further workup to follow.   Time spent:32 minutes reviewing chart, interviewing and examining patient and formulating plan of care.     Chaya Jan, MD [facility] at [facility]

## 2022-03-17 ENCOUNTER — Telehealth (HOSPITAL_BASED_OUTPATIENT_CLINIC_OR_DEPARTMENT_OTHER): Payer: Self-pay | Admitting: *Deleted

## 2022-03-17 ENCOUNTER — Telehealth: Payer: Self-pay | Admitting: Internal Medicine

## 2022-03-17 NOTE — Telephone Encounter (Signed)
Called office for insurance prior authorization for the Echocardiogram ordered by Dr. Stephan Minister

## 2022-03-17 NOTE — Telephone Encounter (Signed)
The echocardiogram requested needs a PA

## 2022-03-24 NOTE — Telephone Encounter (Signed)
No update on prior authorizatin

## 2022-03-30 NOTE — Telephone Encounter (Signed)
Sending to referral coordinator for assistance/clarification.

## 2022-03-30 NOTE — Telephone Encounter (Signed)
On 12/13 I tried to obtain patients insurance information by calling patient and leaving voicemail due to the insurance not being on file. I didn't receive a call back regarding the insurance information.  Today 12/26 I called again and left another voicemail to get patients insurance information so I could obtain PA for this.

## 2022-03-30 NOTE — Telephone Encounter (Signed)
Chris Harrington at [facility] called to follow up on this request. Informed that MD is out of the office today. Is another provider able to assist?  Please advise.

## 2022-03-30 NOTE — Telephone Encounter (Signed)
Referral coordinator states she is calling to get auth now.

## 2022-03-31 NOTE — Telephone Encounter (Signed)
Left a Mychart message informing pt of insurance request.

## 2022-04-01 NOTE — Telephone Encounter (Signed)
Left message for patient to call and discuss scheduling the Echocardiogram ordered by Dr, Ardyth Harps

## 2022-04-08 NOTE — Progress Notes (Deleted)
Name: Chris Harrington  Age/ Sex: 47 y.o., male   MRN/ DOB: [id number], 09/27/1975     PCP: Isaac Bliss, Rayford Halsted, MD   Reason for Endocrinology Evaluation: Type 2 Diabetes Mellitus  Initial Endocrine Consultative Visit: 02/19/2020    PATIENT IDENTIFIER: [name] is a 47 y.o. male with a past medical history of DM and HTN. The patient has followed with Endocrinology clinic since 02/19/2020 for consultative assistance with management of his diabetes.  DIABETIC HISTORY:  [name] was diagnosed with DM in 01/2020, he was hospitalized with a serum  glucose of 624 mg/dL but no DKA.He was discharged on insulin and Metformin but was unable to swallow the Metformin pills and we switched it to Trulicity.  His hemoglobin A1c was 13.1% upon presentation.     HTN History:  Due to hx of HTn with hypokalemia, we checked aldo: renin , which was normal as well as normal  Dexamethasone suppression test at 0.6 ug/dL. 02/2020   DYSLIPIDEMIA HISTORY:  LDL above goal at 122 mg/dL. We started Atorvastatin in 05/2020  SUBJECTIVE:   During the last visit (05/12/2021): A1c 5.5 %     Today (04/08/2022): [name] is here for a follow up on diabetes management. He has not been to our clinic in 11 months.  He checks his blood sugars 3 times weekly.The patient has not  had hypoglycemic episodes since the last clinic visit.  He presented to the ED with chest pain on 03/10/2022 Diovan-HCTZ was added to amlodipine due to a BP of 150/90 Denies nausea or vomiting  or diarrhea    HOME DIABETES REGIMEN:  Trulicity 1.5 mg weekly ( Monday )        Statin: no ACE-I/ARB: no    METER DOWNLOAD SUMMARY: Did not bring     DIABETIC COMPLICATIONS: Microvascular complications:   Denies: CKD, neuropathy, retinopathy Last Eye Exam: Completed 02/2020  Macrovascular complications:   Denies: CAD, CVA, PVD   HISTORY:  Past Medical History:  Past Medical History:  Diagnosis Date   Hypertension     Obesity    Past Surgical History:  Past Surgical History:  Procedure Laterality Date   TONSILECTOMY, ADENOIDECTOMY, BILATERAL MYRINGOTOMY AND TUBES     pt has not had tubes in ears   Social History:  reports that he has never smoked. He has never used smokeless tobacco. He reports current alcohol use. He reports that he does not use drugs. Family History:  Family History  Problem Relation Age of Onset   Hypertension Mother    Diabetes Mother    Heart disease Mother    Stroke Mother    Hypertension Father    Cancer Paternal Grandmother        passed from unknown cancer   Hypertension Maternal Grandmother    Hypertension Maternal Grandfather      HOME MEDICATIONS: Allergies as of 04/09/2022       Reactions   Cranberry Swelling        Medication List        Accurate as of April 08, 2022  2:21 PM. If you have any questions, ask your nurse or doctor.          Accu-Chek Softclix Lancets lancets Use once daily for glucose control with Accu Chek Aviva. DxE11.9.   amLODipine 10 MG tablet Commonly known as: NORVASC TAKE 1 TABLET(10 MG) BY MOUTH DAILY What changed: when to take this   atorvastatin 10 MG tablet Commonly known as: LIPITOR Take 1  tablet (10 mg total) by mouth daily.   valsartan-hydrochlorothiazide 160-25 MG tablet Commonly known as: DIOVAN-HCT Take 1 tablet by mouth daily.         OBJECTIVE:   Vital Signs: There were no vitals taken for this visit.  Wt Readings from Last 3 Encounters:  03/16/22 207 lb 3.2 oz (94 kg)  03/10/22 200 lb (90.7 kg)  01/19/22 210 lb 1.6 oz (95.3 kg)     Exam: General: Pt appears well and is in NAD Nasal turbinates swollen and erythematous with the left nostril ulceration  Lungs: Clear with good BS bilat with no rales, rhonchi, or wheezes  Heart: RRR   Abdomen: Normoactive bowel sounds, soft, nontender, without masses or organomegaly palpable  Extremities: No pretibial edema.  Neuro: MS is good with  appropriate affect, pt is alert and Ox3     DM foot exam: 05/12/2020   The skin of the feet is intact without sores or ulcerations. The pedal pulses are 2+ on right and 2+ on left. The sensation is intact to a screening 5.07, 10 gram monofilament bilaterally    DATA REVIEWED:  Lab Results  Component Value Date   HGBA1C 6.3 01/19/2022   HGBA1C 5.5 05/12/2021   HGBA1C 5.3 09/23/2020       ASSESSMENT / PLAN / RECOMMENDATIONS:   1) Type 2 Diabetes Mellitus, Optimally controlled, Without  complications - Most recent A1c of 5.5 %. Goal A1c < 7.0 %.    - Praised the pt on improved glycemic control  -I have offered reducing his Trulicity but he is comfortable with the current regimen - Unable to swallow large pills , hence he was unable to try metformin  MEDICATIONS: -Continue Trulicity 1.5  mg weekly     EDUCATION / INSTRUCTIONS: BG monitoring instructions: Patient is instructed to check his blood sugars 1 times a day. Call [facility] if: BG persistently < 70  I reviewed the Rule of 15 for the treatment of hypoglycemia in detail with the patient. Literature supplied.    2) Diabetic complications:  Eye: Does not have known diabetic retinopathy.  Neuro/ Feet: Does not have known diabetic peripheral neuropathy .  Renal: Patient does not have known baseline CKD. He   is not on an ACEI/ARB at present.    3) Dyslipidemia :    -His LDL is above goal, I have attempted multiple times to start him on statin therapy but he declines -He understands the cardiovascular benefits to statins      Follow-up in 6 months     Signed electronically by: Mack Guise, MD  [facility]  [address] [place], [place] [address] Phone: [phone] FAX: [phone]   CC: Isaac Bliss, Rayford Halsted, MD [place] [address] Phone: [phone]  Fax: [phone]  Return to  Endocrinology clinic as below: Future Appointments  Date Time Provider [place]  04/09/2022 11:10 AM Ashly Goethe, Melanie Crazier, MD [facility] None  04/27/2022 10:40 AM [facility]

## 2022-04-09 ENCOUNTER — Ambulatory Visit: Payer: Self-pay | Admitting: Internal Medicine

## 2022-04-13 NOTE — Telephone Encounter (Signed)
Left message for patient to call and discuss scheduling the Echocardiogram ordered by Dr. Hernandez 

## 2022-04-21 NOTE — Telephone Encounter (Signed)
Left message for patient to call and discuss scheduling the Echocardiogram ordered by Dr. Jerilee Hoh

## 2022-04-27 ENCOUNTER — Other Ambulatory Visit: Payer: Self-pay

## 2022-04-29 ENCOUNTER — Encounter (HOSPITAL_BASED_OUTPATIENT_CLINIC_OR_DEPARTMENT_OTHER): Payer: Self-pay | Admitting: *Deleted

## 2022-04-29 NOTE — Telephone Encounter (Signed)
Patient has not responded to our calls for scheduling.  Will mail letter requesting patient call the office.

## 2022-08-15 ENCOUNTER — Other Ambulatory Visit: Payer: Self-pay

## 2022-08-15 ENCOUNTER — Encounter (HOSPITAL_BASED_OUTPATIENT_CLINIC_OR_DEPARTMENT_OTHER): Payer: Self-pay

## 2022-08-15 ENCOUNTER — Emergency Department (HOSPITAL_BASED_OUTPATIENT_CLINIC_OR_DEPARTMENT_OTHER)
Admission: EM | Admit: 2022-08-15 | Discharge: 2022-08-15 | Disposition: A | Discharge status: Home / Self Care | Payer: Commercial Managed Care - HMO | Attending: Emergency Medicine | Admitting: Emergency Medicine

## 2022-08-15 DIAGNOSIS — H1032 Unspecified acute conjunctivitis, left eye: Secondary | ICD-10-CM | POA: Insufficient documentation

## 2022-08-15 DIAGNOSIS — H5712 Ocular pain, left eye: Secondary | ICD-10-CM | POA: Diagnosis present

## 2022-08-15 MED ORDER — FLUORESCEIN SODIUM 1 MG OP STRP
1.0000 | ORAL_STRIP | Freq: Once | OPHTHALMIC | Status: AC
  Administered 2022-08-15: 1 via OPHTHALMIC
  Filled 2022-08-15: qty 1

## 2022-08-15 MED ORDER — TETRACAINE HCL 0.5 % OP SOLN
2.0000 [drp] | Freq: Once | OPHTHALMIC | Status: AC
  Administered 2022-08-15: 2 [drp] via OPHTHALMIC
  Filled 2022-08-15: qty 4

## 2022-08-15 MED ORDER — ERYTHROMYCIN 5 MG/GM OP OINT: TOPICAL_OINTMENT | OPHTHALMIC | 0 refills | Status: DC

## 2022-08-15 NOTE — ED Triage Notes (Signed)
Pt states his LEFT eye has been red and itchy since yesterday at approx 2pm and now has 8/10 pain.

## 2022-08-15 NOTE — Discharge Instructions (Signed)
You were seen in the department today with left eye redness and discomfort.  Your eye pressures are normal and I am calling in some antibiotic ointment.  If your symptoms continue he should follow-up with an eye doctor.  I will list the name of a eye doctor on-call for me today on this paperwork.  Call the office on Monday if you continue to have symptoms.

## 2022-08-15 NOTE — ED Provider Notes (Signed)
Emergency Department Provider Note   I have reviewed the triage vital signs and the nursing notes.   HISTORY  Chief Complaint Eye Pain   HPI Chris Harrington is a 47 y.o. male past history of hypertension presents emergency department with left eye redness with pain and some itchiness.  Symptoms began yesterday.  Denies injury or perceived foreign body in the eye.  He initially had some eye irritation which then developed into headache.  No fevers or chills.  No vision disturbance.  He does not wear contact lenses. Mild discharge noted.   Past Medical History:  Diagnosis Date   Hypertension    Obesity     Review of Systems  Constitutional: No fever/chills Eyes: No visual changes. Left eye pain and redness.  Cardiovascular: Denies chest pain. Respiratory: Denies shortness of breath. Gastrointestinal: No abdominal pain.   Musculoskeletal: Negative for back pain. Skin: Negative for rash. Neurological: Negative for focal weakness or numbness.  ____________________________________________   PHYSICAL EXAM:  VITAL SIGNS: ED Triage Vitals  Enc Vitals Group     BP 08/15/22 0631 (!) 155/105     Pulse Rate 08/15/22 0631 73     Resp 08/15/22 0631 18     Temp 08/15/22 0631 98.7 F (37.1 C)     Temp src --      SpO2 08/15/22 0631 98 %     Weight 08/15/22 0632 190 lb (86.2 kg)     Height 08/15/22 0632 5\' 3"  (1.6 m)   Constitutional: Alert and oriented. Well appearing and in no acute distress. Eyes: Conjunctivae are slightly injected on the left. No proptosis. EOMI. PERRL. Mild discharge. IOP: Right (20) Left (17).  No corneal abrasion appreciated with staining.  Head: Atraumatic. Nose: No congestion/rhinnorhea. Mouth/Throat: Mucous membranes are moist.   Neck: No stridor.   Cardiovascular: Good peripheral circulation. Grossly normal heart sounds.   Respiratory: Normal respiratory effort. Gastrointestinal: No distention.  Musculoskeletal: No gross deformities of  extremities. Neurologic:  Normal speech and language.  Skin:  Skin is warm, dry and intact. No rash noted.   ____________________________________________   PROCEDURES  Procedure(s) performed:   Procedures  None  ____________________________________________   INITIAL IMPRESSION / ASSESSMENT AND PLAN / ED COURSE  Pertinent labs & imaging results that were available during my care of the patient were reviewed by me and considered in my medical decision making (see chart for details).   This patient is Presenting for Evaluation of eye pain, which does require a range of treatment options, and is a complaint that involves a moderate risk of morbidity and mortality.  The Differential Diagnoses include conjunctivitis, acute glaucoma, corneal abrasion, CRAO, etc.  Critical Interventions-    Medications  tetracaine (PONTOCAINE) 0.5 % ophthalmic solution 2 drop (2 drops Both Eyes Given 08/15/22 0642)  fluorescein ophthalmic strip 1 strip (1 strip Left Eye Given 08/15/22 0642)    Reassessment after intervention: pain improved.    Medical Decision Making: Summary:  Patient presents emergency department left eye redness and mild discharge with some discomfort.  No corneal abrasion appreciated.  Intraocular pressures normal bilaterally.  Could be allergic versus viral versus bacterial conjunctivitis.  Plan for erythromycin ointment.  Patient does not wear contact lenses.  Contact information given for ophthalmology for follow up should symptoms continue/worsen.   Patient's presentation is most consistent with acute, uncomplicated illness.   Disposition: discharge  ____________________________________________  FINAL CLINICAL IMPRESSION(S) / ED DIAGNOSES  Final diagnoses:  Acute conjunctivitis of left eye, unspecified acute  conjunctivitis type     NEW OUTPATIENT MEDICATIONS STARTED DURING THIS VISIT:  New Prescriptions   ERYTHROMYCIN OPHTHALMIC OINTMENT    Place a 1/2 inch ribbon  of ointment into the left lower eyelid 4 times daily for 7 days.    Note:  This document was prepared using Dragon voice recognition software and may include unintentional dictation errors.  Alona Bene, MD, [facility] Emergency Medicine    Jaylin Roundy, Arlyss Repress, MD 08/15/22 [phone]

## 2022-08-15 NOTE — ED Notes (Signed)
VISUAL ACUITY:  LEFT - 20/40 RIGHT - 20/25 BILAT - 20/25

## 2022-10-21 ENCOUNTER — Ambulatory Visit: Payer: Commercial Managed Care - HMO | Admitting: Internal Medicine

## 2022-10-21 DIAGNOSIS — E119 Type 2 diabetes mellitus without complications: Secondary | ICD-10-CM

## 2022-12-01 ENCOUNTER — Ambulatory Visit
Admission: EM | Admit: 2022-12-01 | Discharge: 2022-12-01 | Disposition: A | Discharge status: Home / Self Care | Payer: Commercial Managed Care - HMO | Attending: Family Medicine | Admitting: Family Medicine

## 2022-12-01 ENCOUNTER — Other Ambulatory Visit: Payer: Self-pay

## 2022-12-01 DIAGNOSIS — I10 Essential (primary) hypertension: Secondary | ICD-10-CM | POA: Diagnosis not present

## 2022-12-01 DIAGNOSIS — R519 Headache, unspecified: Secondary | ICD-10-CM | POA: Diagnosis not present

## 2022-12-01 MED ORDER — IBUPROFEN 800 MG PO TABS: 800.0000 mg | ORAL_TABLET | Freq: Three times a day (TID) | ORAL | 0 refills | Status: DC

## 2022-12-01 MED ORDER — KETOROLAC TROMETHAMINE 30 MG/ML IJ SOLN
30.0000 mg | Freq: Once | INTRAMUSCULAR | Status: AC
  Administered 2022-12-01: 30 mg via INTRAMUSCULAR

## 2022-12-01 MED ORDER — VALSARTAN-HYDROCHLOROTHIAZIDE 160-12.5 MG PO TABS: 1.0000 | ORAL_TABLET | Freq: Every day | ORAL | 1 refills | Status: DC

## 2022-12-01 NOTE — Discharge Instructions (Addendum)
Take the BP medicine daily Take ibuprofen 800 mg if you get a headache Follow up with your PCP.  You are overdue.  Call tomorrow

## 2022-12-01 NOTE — ED Provider Notes (Signed)
[name]    CSN: [id number] Arrival date & time: 12/01/22  1909      History   Chief Complaint No chief complaint on file.   HPI Chris Harrington is a 47 y.o. male.   HPI  Patient is here for headache.  He works 2 jobs.  He states he is stressed.  He states he feels like his blood pressure is elevated and wants to have this checked.  He does not usually have headaches.  No nausea or vomiting.  No photophobia.  No recent illness.  No head trauma.  No problems with cognition, strength, or balance As I reviewed medication with him he states the only pill he is taking his a atorvastatin.  Somehow he quit his amlodipine and Diovan.  We discussed that he needs to be taking blood pressure medication as well as his cholesterol medication.  We also discussed that he has not followed up with his primary care doctor as recommended.  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 Authorizing Provider  ibuprofen (ADVIL) 800 MG tablet Take 1 tablet (800 mg total) by mouth 3 (three) times daily. 12/01/22  Yes Eustace Moore, MD  valsartan-hydrochlorothiazide (DIOVAN HCT) 160-12.5 MG tablet Take 1 tablet by mouth daily. 12/01/22  Yes Eustace Moore, MD  Accu-Chek Softclix Lancets lancets Use once daily for glucose control with Accu Chek Aviva. DxE11.9. 02/01/20   Philip Aspen, Limmie Patricia, MD  atorvastatin (LIPITOR) 10 MG tablet Take 1 tablet (10 mg total) by mouth daily. 01/20/22   Philip Aspen, Limmie Patricia, MD  potassium chloride SA (KLOR-CON) 20 MEQ tablet Take 1 tablet (20 mEq total) by mouth daily for 21 days. 03/25/19 07/16/19  Terald Sleeper, MD    Family History Family History  Problem Relation Age of Onset   Hypertension Mother    Diabetes Mother    Heart disease Mother    Stroke Mother    Hypertension Father    Cancer Paternal Grandmother        passed from unknown cancer   Hypertension Maternal Grandmother    Hypertension Maternal Grandfather     Social History Social History   Tobacco Use   Smoking status: Never   Smokeless tobacco: Never  Substance Use Topics   Alcohol use: Yes    Comment: occasional   Drug use: No     Allergies   Cranberry   Review of Systems Review of Systems See HPI  Physical Exam Triage Vital Signs ED Triage Vitals  Encounter Vitals Group     BP 12/01/22 1917 (!) 157/94     Systolic BP Percentile --      Diastolic BP Percentile --      Pulse Rate 12/01/22 1917 71     Resp 12/01/22 1917 18  Temp 12/01/22 1917 98.3 F (36.8 C)     Temp Source 12/01/22 1917 Oral     SpO2 12/01/22 1917 99 %     Weight --      Height --      Head Circumference --      Peak Flow --      Pain Score 12/01/22 1918 9     Pain Loc --      Pain Education --      Exclude from Growth Chart --    No data found.  Updated Vital Signs BP 133/84   Pulse 79   Temp 98.3 F (36.8 C) (Oral)   Resp 16   SpO2 98%      Physical Exam Constitutional:      General: He is not in acute distress.    Appearance: He is well-developed. He is obese. He is not ill-appearing.  HENT:     Head: Normocephalic and atraumatic.  Eyes:     Extraocular Movements: Extraocular movements intact.     Conjunctiva/sclera: Conjunctivae normal.     Pupils: Pupils are equal, round, and reactive to light.     Comments: Discs are flat   Cardiovascular:     Rate and Rhythm: Normal rate and regular rhythm.     Heart sounds: Normal heart sounds.  Pulmonary:     Effort: Pulmonary effort is normal. No respiratory distress.     Breath sounds: Normal breath sounds.  Abdominal:     General: There is no distension.     Palpations: Abdomen is soft.  Musculoskeletal:        General: Normal range of motion.     Cervical back: Normal range of motion.  Skin:    General: Skin is warm and dry.  Neurological:     Mental Status: He is alert.       Medications Ordered in UC Medications  ketorolac (TORADOL) 30 MG/ML injection 30 mg (30 mg Intramuscular Given 12/01/22 1949)    Initial Impression / Assessment and Plan / UC Course  I have reviewed the triage vital signs and the nursing notes.  Pertinent labs & imaging results that were available during my care of the patient were reviewed by me and considered in my medical decision making (see chart for details).     Somehow this patient has stopped taking his blood pressure medication.  His second blood pressure here is acceptable at 133/84.  His blood pressure has been going high though and causing headaches.  He also had cardiomegaly on his last chest x-ray and never went for his cardiology follow-up.  I am putting him back on blood pressure medication.  I am encouraging him to see his family medicine doctor as soon as possible. Final Clinical Impressions(s) / UC Diagnoses   Final diagnoses:  Essential hypertension, benign  Acute intractable headache, unspecified headache type     Discharge Instructions      Take the BP medicine daily Take ibuprofen 800 mg if you get a headache Follow up with your PCP.  You are overdue.  Call tomorrow      ED Prescriptions     Medication Sig Dispense Auth. Provider   valsartan-hydrochlorothiazide (DIOVAN HCT) 160-12.5 MG tablet Take 1 tablet by mouth daily. 30 tablet Eustace Moore, MD   ibuprofen (ADVIL) 800 MG tablet Take  1 tablet (800 mg total) by mouth 3 (three) times daily. 21 tablet Eustace Moore, MD      PDMP not  reviewed this encounter.   Eustace Moore, MD 12/01/22 2007

## 2022-12-01 NOTE — ED Triage Notes (Signed)
Headache since yesterday

## 2023-04-08 ENCOUNTER — Other Ambulatory Visit: Payer: Self-pay | Admitting: Internal Medicine

## 2023-04-08 DIAGNOSIS — I10 Essential (primary) hypertension: Secondary | ICD-10-CM

## 2023-07-05 ENCOUNTER — Other Ambulatory Visit: Payer: Self-pay | Admitting: Internal Medicine

## 2023-07-05 DIAGNOSIS — I10 Essential (primary) hypertension: Secondary | ICD-10-CM

## 2023-07-11 ENCOUNTER — Ambulatory Visit: Payer: Self-pay | Admitting: Internal Medicine

## 2023-07-11 ENCOUNTER — Telehealth: Payer: Self-pay | Admitting: Internal Medicine

## 2023-07-11 ENCOUNTER — Other Ambulatory Visit: Payer: Self-pay | Admitting: Internal Medicine

## 2023-07-11 DIAGNOSIS — I10 Essential (primary) hypertension: Secondary | ICD-10-CM

## 2023-07-11 DIAGNOSIS — E119 Type 2 diabetes mellitus without complications: Secondary | ICD-10-CM

## 2023-07-11 NOTE — Telephone Encounter (Signed)
 Last office visit was 03/16/22.  Okay to fill?

## 2023-07-11 NOTE — Telephone Encounter (Signed)
 Prescription Request  07/11/2023  LOV: Visit date not found  *scheduled for 08/08/23  pt says this morning his BP was 211/113. Insurance does not take affect until 08/04/22, hence the later appointment  What is the name of the medication or equipment? valsartan-hydrochlorothiazide (DIOVAN HCT) 160-12.5 MG tablet (requesting 160-25mg  as previously prescribed)  Have you contacted your pharmacy to request a refill? No   Which pharmacy would you like this sent to?  [id number] - [place], [place] - [address] AT [facility] OF [address] [place]  [address] Phone: [phone] Fax: [phone]    Patient notified that their request is being sent to the clinical staff for review and that they should receive a response within 2 business days.   Please advise at Mobile [phone] (mobile)

## 2023-08-08 ENCOUNTER — Ambulatory Visit (INDEPENDENT_AMBULATORY_CARE_PROVIDER_SITE_OTHER): Payer: Self-pay | Admitting: Internal Medicine

## 2023-08-08 ENCOUNTER — Encounter: Payer: Self-pay | Admitting: Internal Medicine

## 2023-08-08 VITALS — BP 160/98 | HR 87 | Temp 98.8°F | Wt 228.0 lb

## 2023-08-08 DIAGNOSIS — Z7984 Long term (current) use of oral hypoglycemic drugs: Secondary | ICD-10-CM

## 2023-08-08 DIAGNOSIS — E559 Vitamin D deficiency, unspecified: Secondary | ICD-10-CM | POA: Diagnosis not present

## 2023-08-08 DIAGNOSIS — E785 Hyperlipidemia, unspecified: Secondary | ICD-10-CM | POA: Diagnosis not present

## 2023-08-08 DIAGNOSIS — I10 Essential (primary) hypertension: Secondary | ICD-10-CM

## 2023-08-08 DIAGNOSIS — N182 Chronic kidney disease, stage 2 (mild): Secondary | ICD-10-CM

## 2023-08-08 DIAGNOSIS — Z114 Encounter for screening for human immunodeficiency virus [HIV]: Secondary | ICD-10-CM | POA: Diagnosis not present

## 2023-08-08 DIAGNOSIS — E119 Type 2 diabetes mellitus without complications: Secondary | ICD-10-CM

## 2023-08-08 DIAGNOSIS — Z1159 Encounter for screening for other viral diseases: Secondary | ICD-10-CM | POA: Diagnosis not present

## 2023-08-08 LAB — CBC WITH DIFFERENTIAL/PLATELET
Basophils Absolute: 0.1 10*3/uL (ref 0.0–0.1)
Basophils Relative: 1.1 % (ref 0.0–3.0)
Eosinophils Absolute: 0.2 10*3/uL (ref 0.0–0.7)
Eosinophils Relative: 2.7 % (ref 0.0–5.0)
HCT: 41.6 % (ref 39.0–52.0)
Hemoglobin: 13.9 g/dL (ref 13.0–17.0)
Lymphocytes Relative: 29.7 % (ref 12.0–46.0)
Lymphs Abs: 2 10*3/uL (ref 0.7–4.0)
MCHC: 33.4 g/dL (ref 30.0–36.0)
MCV: 88.7 fl (ref 78.0–100.0)
Monocytes Absolute: 0.5 10*3/uL (ref 0.1–1.0)
Monocytes Relative: 7.4 % (ref 3.0–12.0)
Neutro Abs: 3.9 10*3/uL (ref 1.4–7.7)
Neutrophils Relative %: 59.1 % (ref 43.0–77.0)
Platelets: 269 10*3/uL (ref 150.0–400.0)
RBC: 4.69 Mil/uL (ref 4.22–5.81)
RDW: 13.5 % (ref 11.5–15.5)
WBC: 6.7 10*3/uL (ref 4.0–10.5)

## 2023-08-08 LAB — COMPREHENSIVE METABOLIC PANEL WITH GFR
ALT: 19 U/L (ref 0–53)
AST: 23 U/L (ref 0–37)
Albumin: 3.9 g/dL (ref 3.5–5.2)
Alkaline Phosphatase: 52 U/L (ref 39–117)
BUN: 22 mg/dL (ref 6–23)
CO2: 28 meq/L (ref 19–32)
Calcium: 9.5 mg/dL (ref 8.4–10.5)
Chloride: 101 meq/L (ref 96–112)
Creatinine, Ser: 1.85 mg/dL — ABNORMAL HIGH (ref 0.40–1.50)
GFR: 43.4 mL/min — ABNORMAL LOW (ref >60.00)
Glucose, Bld: 202 mg/dL — ABNORMAL HIGH (ref 70–99)
Potassium: 2.9 meq/L — ABNORMAL LOW (ref 3.5–5.1)
Sodium: 138 meq/L (ref 135–145)
Total Bilirubin: 0.6 mg/dL (ref 0.2–1.2)
Total Protein: 7.8 g/dL (ref 6.0–8.3)

## 2023-08-08 LAB — LIPID PANEL
Cholesterol: 176 mg/dL (ref 0–200)
HDL: 28.8 mg/dL — ABNORMAL LOW (ref >39.00)
LDL Cholesterol: 105 mg/dL — ABNORMAL HIGH (ref 0–99)
NonHDL: 147.02
Total CHOL/HDL Ratio: 6
Triglycerides: 212 mg/dL — ABNORMAL HIGH (ref 0.0–149.0)
VLDL: 42.4 mg/dL — ABNORMAL HIGH (ref 0.0–40.0)

## 2023-08-08 LAB — POCT GLYCOSYLATED HEMOGLOBIN (HGB A1C): Hemoglobin A1C: 7.3 % — AB (ref 4.0–5.6)

## 2023-08-08 LAB — MICROALBUMIN / CREATININE URINE RATIO
Creatinine,U: 107.7 mg/dL
Microalb Creat Ratio: 48.1 mg/g — ABNORMAL HIGH (ref 0.0–30.0)
Microalb, Ur: 5.2 mg/dL — ABNORMAL HIGH (ref 0.0–1.9)

## 2023-08-08 LAB — VITAMIN D 25 HYDROXY (VIT D DEFICIENCY, FRACTURES): VITD: 14.18 ng/mL — ABNORMAL LOW (ref 30.00–100.00)

## 2023-08-08 MED ORDER — DAPAGLIFLOZIN PROPANEDIOL 5 MG PO TABS: 5.0000 mg | ORAL_TABLET | Freq: Every day | ORAL | 1 refills | Status: DC

## 2023-08-08 MED ORDER — VALSARTAN-HYDROCHLOROTHIAZIDE 160-25 MG PO TABS: 1.0000 | ORAL_TABLET | Freq: Every day | ORAL | 1 refills | Status: DC

## 2023-08-08 MED ORDER — ATORVASTATIN CALCIUM 10 MG PO TABS: 20.0000 mg | ORAL_TABLET | Freq: Every day | ORAL | 1 refills | Status: DC

## 2023-08-08 MED ORDER — CARVEDILOL 3.125 MG PO TABS: 3.1250 mg | ORAL_TABLET | Freq: Two times a day (BID) | ORAL | 1 refills | Status: DC

## 2023-08-08 NOTE — Progress Notes (Signed)
 Established Patient Office Visit     CC/Reason for Visit: Discuss chronic conditions  HPI: Chris Harrington is a 48 y.o. male who is coming in today for the above mentioned reasons. Past Medical History is significant for: Hypertension, hyperlipidemia, type 2 diabetes, morbid obesity, vitamin D  deficiency.  I have not seen him since 2023.  He has not been adherent to medication.  He has gained about 20 pounds.   Past Medical/Surgical History: Past Medical History:  Diagnosis Date   Hypertension    Obesity     Past Surgical History:  Procedure Laterality Date   TONSILECTOMY, ADENOIDECTOMY, BILATERAL MYRINGOTOMY AND TUBES     pt has not had tubes in ears    Social History:  reports that he has never smoked. He has never used smokeless tobacco. He reports current alcohol use. He reports that he does not use drugs.  Allergies: Allergies  Allergen Reactions   Cranberry Swelling    Family History:  Family History  Problem Relation Age of Onset   Hypertension Mother    Diabetes Mother    Heart disease Mother    Stroke Mother    Hypertension Father    Cancer Paternal Grandmother        passed from unknown cancer   Hypertension Maternal Grandmother    Hypertension Maternal Grandfather      Current Outpatient Medications:    Accu-Chek Softclix Lancets lancets, Use once daily for glucose control with Accu Chek Aviva. DxE11.9., Disp: 100 each, Rfl: 12   carvedilol (COREG) 3.125 MG tablet, Take 1 tablet (3.125 mg total) by mouth 2 (two) times daily with a meal., Disp: 180 tablet, Rfl: 1   dapagliflozin propanediol (FARXIGA) 5 MG TABS tablet, Take 1 tablet (5 mg total) by mouth daily before breakfast., Disp: 90 tablet, Rfl: 1   ibuprofen  (ADVIL ) 800 MG tablet, Take 1 tablet (800 mg total) by mouth 3 (three) times daily., Disp: 21 tablet, Rfl: 0   atorvastatin  (LIPITOR) 10 MG tablet, Take 2 tablets (20 mg total) by mouth daily., Disp: 90 tablet, Rfl: 1    valsartan -hydrochlorothiazide  (DIOVAN -HCT) 160-25 MG tablet, Take 1 tablet by mouth daily., Disp: 90 tablet, Rfl: 1  Review of Systems:  Negative unless indicated in HPI.   Physical Exam: Vitals:   08/08/23 1357 08/08/23 1358  BP: (!) 153/99 (!) 160/98  Pulse: 87   Temp: 98.8 F (37.1 C)   TempSrc: Oral   SpO2: 99%   Weight: 228 lb (103.4 kg)     Body mass index is 40.39 kg/m.   Physical Exam Vitals reviewed.  Constitutional:      Appearance: Normal appearance. He is obese.  HENT:     Head: Normocephalic and atraumatic.  Eyes:     Conjunctiva/sclera: Conjunctivae normal.  Cardiovascular:     Rate and Rhythm: Normal rate and regular rhythm.  Pulmonary:     Effort: Pulmonary effort is normal.     Breath sounds: Normal breath sounds.  Skin:    General: Skin is warm and dry.  Neurological:     General: No focal deficit present.     Mental Status: He is alert and oriented to person, place, and time.  Psychiatric:        Mood and Affect: Mood normal.        Behavior: Behavior normal.        Thought Content: Thought content normal.        Judgment: Judgment normal.  Impression and Plan:  Type 2 diabetes mellitus without complication, without long-term current use of insulin  (HCC) -     POCT glycosylated hemoglobin (Hb A1C) -     Microalbumin / creatinine urine ratio; Future -     Dapagliflozin Propanediol; Take 1 tablet (5 mg total) by mouth daily before breakfast.  Dispense: 90 tablet; Refill: 1  Primary hypertension -     CBC with Differential/Platelet; Future -     Comprehensive metabolic panel with GFR; Future -     Valsartan -hydroCHLOROthiazide ; Take 1 tablet by mouth daily.  Dispense: 90 tablet; Refill: 1 -     Carvedilol; Take 1 tablet (3.125 mg total) by mouth 2 (two) times daily with a meal.  Dispense: 180 tablet; Refill: 1  Dyslipidemia -     Lipid panel; Future -     Atorvastatin  Calcium ; Take 2 tablets (20 mg total) by mouth daily.  Dispense: 90  tablet; Refill: 1  Vitamin D  deficiency -     VITAMIN D  25 Hydroxy (Vit-D Deficiency, Fractures); Future  CKD (chronic kidney disease) stage 2, GFR 60-89 ml/min -     Dapagliflozin Propanediol; Take 1 tablet (5 mg total) by mouth daily before breakfast.  Dispense: 90 tablet; Refill: 1  Encounter for hepatitis C screening test for low risk patient -     Hepatitis C antibody; Future  Encounter for screening for HIV -     HIV Antibody (routine testing w rflx); Future   - A1c is elevated to 7.3.  Not currently on any medication.  Given his chronic kidney disease, we will add Farxiga and recheck A1c in 3 months. - Check cholesterol today, refill atorvastatin  20 mg. - Start SGLT2 for chronic kidney disease. - Blood pressure is uncontrolled.  Continue Diovan  HCT, add carvedilol 3.125 mg twice daily.  He did not tolerate amlodipine  in the past due to lower extremity edema.  Time spent:32 minutes reviewing chart, interviewing and examining patient and formulating plan of care.     Marguerita Shih, MD [facility]

## 2023-08-09 LAB — HEPATITIS C ANTIBODY: Hepatitis C Ab: NONREACTIVE

## 2023-08-09 LAB — HIV ANTIBODY (ROUTINE TESTING W REFLEX): HIV 1&2 Ab, 4th Generation: NONREACTIVE

## 2023-08-10 ENCOUNTER — Other Ambulatory Visit: Payer: Self-pay | Admitting: Internal Medicine

## 2023-08-10 DIAGNOSIS — E559 Vitamin D deficiency, unspecified: Secondary | ICD-10-CM

## 2023-08-10 DIAGNOSIS — E876 Hypokalemia: Secondary | ICD-10-CM

## 2023-08-10 DIAGNOSIS — E785 Hyperlipidemia, unspecified: Secondary | ICD-10-CM

## 2023-08-10 MED ORDER — POTASSIUM CHLORIDE CRYS ER 20 MEQ PO TBCR: 20.0000 meq | EXTENDED_RELEASE_TABLET | Freq: Two times a day (BID) | ORAL | 0 refills | Status: DC

## 2023-08-10 MED ORDER — ATORVASTATIN CALCIUM 40 MG PO TABS: 40.0000 mg | ORAL_TABLET | Freq: Every day | ORAL | 1 refills | Status: DC

## 2023-08-10 MED ORDER — VITAMIN D (ERGOCALCIFEROL) 1.25 MG (50000 UNIT) PO CAPS: 50000.0000 [IU] | ORAL_CAPSULE | ORAL | 0 refills | Status: AC

## 2023-08-16 ENCOUNTER — Ambulatory Visit: Payer: Self-pay | Admitting: *Deleted

## 2023-08-23 ENCOUNTER — Encounter: Payer: Self-pay | Admitting: Internal Medicine

## 2023-09-28 ENCOUNTER — Emergency Department (HOSPITAL_COMMUNITY)

## 2023-09-28 ENCOUNTER — Other Ambulatory Visit: Payer: Self-pay

## 2023-09-28 ENCOUNTER — Ambulatory Visit
Admission: EM | Admit: 2023-09-28 | Discharge: 2023-09-28 | Disposition: A | Discharge status: Home / Self Care | Attending: Family Medicine | Admitting: Family Medicine

## 2023-09-28 ENCOUNTER — Encounter (HOSPITAL_COMMUNITY): Payer: Self-pay | Admitting: Emergency Medicine

## 2023-09-28 ENCOUNTER — Encounter: Payer: Self-pay | Admitting: Internal Medicine

## 2023-09-28 ENCOUNTER — Observation Stay (HOSPITAL_COMMUNITY)
Admission: EM | Admit: 2023-09-28 | Discharge: 2023-09-29 | Disposition: A | Discharge status: Home / Self Care | Attending: Internal Medicine | Admitting: Internal Medicine

## 2023-09-28 DIAGNOSIS — R519 Headache, unspecified: Secondary | ICD-10-CM | POA: Diagnosis not present

## 2023-09-28 DIAGNOSIS — I160 Hypertensive urgency: Secondary | ICD-10-CM | POA: Diagnosis not present

## 2023-09-28 DIAGNOSIS — R5381 Other malaise: Secondary | ICD-10-CM | POA: Insufficient documentation

## 2023-09-28 DIAGNOSIS — Z79899 Other long term (current) drug therapy: Secondary | ICD-10-CM | POA: Diagnosis not present

## 2023-09-28 DIAGNOSIS — I517 Cardiomegaly: Secondary | ICD-10-CM | POA: Diagnosis not present

## 2023-09-28 DIAGNOSIS — R110 Nausea: Secondary | ICD-10-CM | POA: Diagnosis not present

## 2023-09-28 DIAGNOSIS — I10 Essential (primary) hypertension: Secondary | ICD-10-CM | POA: Diagnosis not present

## 2023-09-28 DIAGNOSIS — G4489 Other headache syndrome: Secondary | ICD-10-CM | POA: Diagnosis not present

## 2023-09-28 DIAGNOSIS — R0989 Other specified symptoms and signs involving the circulatory and respiratory systems: Secondary | ICD-10-CM | POA: Diagnosis not present

## 2023-09-28 DIAGNOSIS — I169 Hypertensive crisis, unspecified: Secondary | ICD-10-CM | POA: Diagnosis present

## 2023-09-28 LAB — CBC WITH DIFFERENTIAL/PLATELET
Abs Immature Granulocytes: 0.02 10*3/uL (ref 0.00–0.07)
Basophils Absolute: 0.1 10*3/uL (ref 0.0–0.1)
Basophils Relative: 1 %
Eosinophils Absolute: 0.2 10*3/uL (ref 0.0–0.5)
Eosinophils Relative: 3 %
HCT: 44.2 % (ref 39.0–52.0)
Hemoglobin: 14.9 g/dL (ref 13.0–17.0)
Immature Granulocytes: 0 %
Lymphocytes Relative: 33 %
Lymphs Abs: 1.9 10*3/uL (ref 0.7–4.0)
MCH: 29.4 pg (ref 26.0–34.0)
MCHC: 33.7 g/dL (ref 30.0–36.0)
MCV: 87.4 fL (ref 80.0–100.0)
Monocytes Absolute: 0.3 10*3/uL (ref 0.1–1.0)
Monocytes Relative: 6 %
Neutro Abs: 3.2 10*3/uL (ref 1.7–7.7)
Neutrophils Relative %: 57 %
Platelets: 264 10*3/uL (ref 150–400)
RBC: 5.06 MIL/uL (ref 4.22–5.81)
RDW: 13.2 % (ref 11.5–15.5)
WBC: 5.7 10*3/uL (ref 4.0–10.5)
nRBC: 0 % (ref 0.0–0.2)

## 2023-09-28 LAB — MAGNESIUM: Magnesium: 1.9 mg/dL (ref 1.7–2.4)

## 2023-09-28 LAB — COMPREHENSIVE METABOLIC PANEL WITH GFR
ALT: 16 U/L (ref 0–44)
AST: 19 U/L (ref 15–41)
Albumin: 3.3 g/dL — ABNORMAL LOW (ref 3.5–5.0)
Alkaline Phosphatase: 56 U/L (ref 38–126)
Anion gap: 10 (ref 5–15)
BUN: 17 mg/dL (ref 6–20)
CO2: 25 mmol/L (ref 22–32)
Calcium: 8.5 mg/dL — ABNORMAL LOW (ref 8.9–10.3)
Chloride: 100 mmol/L (ref 98–111)
Creatinine, Ser: 1.54 mg/dL — ABNORMAL HIGH (ref 0.61–1.24)
GFR, Estimated: 56 mL/min — ABNORMAL LOW (ref >60)
Glucose, Bld: 204 mg/dL — ABNORMAL HIGH (ref 70–99)
Potassium: 2.9 mmol/L — ABNORMAL LOW (ref 3.5–5.1)
Sodium: 135 mmol/L (ref 135–145)
Total Bilirubin: 1.2 mg/dL (ref 0.0–1.2)
Total Protein: 7.6 g/dL (ref 6.5–8.1)

## 2023-09-28 MED ORDER — KETOROLAC TROMETHAMINE 30 MG/ML IJ SOLN
30.0000 mg | Freq: Once | INTRAMUSCULAR | Status: AC
  Administered 2023-09-28: 30 mg via INTRAVENOUS
  Filled 2023-09-28: qty 1

## 2023-09-28 MED ORDER — HYDROMORPHONE HCL 1 MG/ML IJ SOLN: 0.5000 mg | INTRAMUSCULAR | Status: DC | PRN

## 2023-09-28 MED ORDER — NITROGLYCERIN IN D5W 200-5 MCG/ML-% IV SOLN
0.0000 ug/min | INTRAVENOUS | Status: DC
  Administered 2023-09-28: 5 ug/min via INTRAVENOUS
  Filled 2023-09-28: qty 250

## 2023-09-28 MED ORDER — ACETAMINOPHEN 650 MG RE SUPP: 650.0000 mg | Freq: Four times a day (QID) | RECTAL | Status: DC | PRN

## 2023-09-28 MED ORDER — ACETAMINOPHEN 500 MG PO TABS: 1000.0000 mg | ORAL_TABLET | Freq: Four times a day (QID) | ORAL | Status: DC | PRN

## 2023-09-28 MED ORDER — NALOXONE HCL 0.4 MG/ML IJ SOLN: 0.4000 mg | INTRAMUSCULAR | Status: DC | PRN

## 2023-09-28 MED ORDER — DIPHENHYDRAMINE HCL 50 MG/ML IJ SOLN
25.0000 mg | Freq: Once | INTRAMUSCULAR | Status: AC
  Administered 2023-09-28: 25 mg via INTRAVENOUS
  Filled 2023-09-28: qty 1

## 2023-09-28 MED ORDER — CARVEDILOL 6.25 MG PO TABS
6.2500 mg | ORAL_TABLET | Freq: Two times a day (BID) | ORAL | 0 refills | Status: DC
Start: 2023-09-28 — End: 2023-10-03

## 2023-09-28 MED ORDER — MELATONIN 3 MG PO TABS: 3.0000 mg | ORAL_TABLET | Freq: Every evening | ORAL | Status: DC | PRN

## 2023-09-28 MED ORDER — KETOROLAC TROMETHAMINE 15 MG/ML IJ SOLN: 15.0000 mg | Freq: Four times a day (QID) | INTRAMUSCULAR | Status: DC | PRN

## 2023-09-28 MED ORDER — MAGNESIUM SULFATE 2 GM/50ML IV SOLN
2.0000 g | Freq: Once | INTRAVENOUS | Status: AC
  Administered 2023-09-28: 2 g via INTRAVENOUS
  Filled 2023-09-28: qty 50

## 2023-09-28 MED ORDER — DEXAMETHASONE SODIUM PHOSPHATE 10 MG/ML IJ SOLN
10.0000 mg | Freq: Once | INTRAMUSCULAR | Status: AC
  Administered 2023-09-28: 10 mg via INTRAVENOUS
  Filled 2023-09-28: qty 1

## 2023-09-28 MED ORDER — ACETAMINOPHEN 325 MG PO TABS: 650.0000 mg | ORAL_TABLET | Freq: Four times a day (QID) | ORAL | Status: DC | PRN

## 2023-09-28 MED ORDER — PROCHLORPERAZINE EDISYLATE 10 MG/2ML IJ SOLN
10.0000 mg | Freq: Once | INTRAMUSCULAR | Status: AC
  Administered 2023-09-28: 10 mg via INTRAVENOUS
  Filled 2023-09-28: qty 2

## 2023-09-28 MED ORDER — LABETALOL HCL 5 MG/ML IV SOLN
10.0000 mg | Freq: Once | INTRAVENOUS | Status: AC
  Administered 2023-09-28: 10 mg via INTRAVENOUS
  Filled 2023-09-28: qty 4

## 2023-09-28 MED ORDER — ACETAMINOPHEN 500 MG PO TABS
1000.0000 mg | ORAL_TABLET | Freq: Once | ORAL | Status: AC
  Administered 2023-09-28: 1000 mg via ORAL
  Filled 2023-09-28: qty 2

## 2023-09-28 MED ORDER — MORPHINE SULFATE (PF) 4 MG/ML IV SOLN
4.0000 mg | Freq: Once | INTRAVENOUS | Status: AC
  Administered 2023-09-28: 4 mg via INTRAVENOUS
  Filled 2023-09-28: qty 1

## 2023-09-28 MED ORDER — ONDANSETRON HCL 4 MG/2ML IJ SOLN: 4.0000 mg | Freq: Four times a day (QID) | INTRAMUSCULAR | Status: DC | PRN

## 2023-09-28 MED ORDER — POTASSIUM CHLORIDE CRYS ER 20 MEQ PO TBCR
40.0000 meq | EXTENDED_RELEASE_TABLET | Freq: Once | ORAL | Status: AC
  Administered 2023-09-28: 40 meq via ORAL
  Filled 2023-09-28: qty 2

## 2023-09-28 MED ORDER — LORAZEPAM 2 MG/ML IJ SOLN: 1.0000 mg | INTRAMUSCULAR | Status: DC | PRN

## 2023-09-28 MED ORDER — HYDRALAZINE HCL 20 MG/ML IJ SOLN
5.0000 mg | Freq: Once | INTRAMUSCULAR | Status: AC
  Administered 2023-09-28: 5 mg via INTRAVENOUS
  Filled 2023-09-28: qty 1

## 2023-09-28 MED ORDER — SODIUM CHLORIDE 0.9 % IV BOLUS
500.0000 mL | Freq: Once | INTRAVENOUS | Status: AC
  Administered 2023-09-28: 500 mL via INTRAVENOUS

## 2023-09-28 MED ORDER — LORAZEPAM 2 MG/ML IJ SOLN
0.5000 mg | Freq: Once | INTRAMUSCULAR | Status: AC
  Administered 2023-09-28: 0.5 mg via INTRAVENOUS
  Filled 2023-09-28: qty 1

## 2023-09-28 NOTE — ED Notes (Signed)
 Patient is being discharged from the Urgent Care and sent to the Emergency Department via EMS . Per [facility], patient is in need of higher level of care due to Hypertensive crisis. Patient is aware and verbalizes understanding of plan of care.  Vitals:   09/28/23 0931 09/28/23 0933  BP:  (!) 187/127  Pulse: 72   Resp: 16   Temp: 98.3 F (36.8 C)   SpO2: 97%

## 2023-09-28 NOTE — ED Triage Notes (Signed)
 Pt states headache since yesterday.  States he has been taking tylenol  at home and yesterday doubled up on his  BP medication.

## 2023-09-28 NOTE — ED Provider Notes (Signed)
 [name] Provider Note   CSN: [id number] Arrival date & time: 09/28/23  1040     Patient presents with: Hypertension and Headache   Chris Harrington is a 48 y.o. male.   HPI   48 year old male presents emergency department with headache and high blood pressure.  Patient states about a month ago his blood pressure medications were changed/adjusted.  Since then he has been intermittently feeling unwell.  However for the past 2 days he has been having a consistent headache and fatigue.  Headache is resolved overnight, no associated neck pain/stiffness, no focal neuro complaint.  Patient denies any chest pain/heaviness or shortness of breath.  Went to urgent care today with concern for headache and was noted to have elevated blood pressure with systolic over 200s so he was sent here by EMS.  He states he is otherwise been compliant with his medications.   Prior to Admission medications   Medication Sig Start Date End Date Taking? Authorizing Provider  Accu-Chek Softclix Lancets lancets Use once daily for glucose control with Accu Chek Aviva. 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 02/01/20   Theophilus Andrews, Tully GRADE, MD  atorvastatin  (LIPITOR) 40 MG tablet Take 1 tablet (40 mg total) by mouth daily. 08/10/23   Theophilus Andrews, Tully GRADE, MD  carvedilol  (COREG ) 3.125 MG tablet Take 1 tablet (3.125 mg total) by mouth 2 (two) times daily with a meal. 08/08/23   Theophilus Andrews, Tully GRADE, MD  dapagliflozin  propanediol (FARXIGA ) 5 MG TABS tablet Take 1 tablet (5 mg total) by mouth daily before breakfast. 08/08/23   Theophilus Andrews, Tully GRADE, MD  ibuprofen  (ADVIL ) 800 MG tablet Take 1 tablet (800 mg total) by mouth 3 (three) times daily. 12/01/22   Maranda Jamee Jacob, MD  potassium chloride  SA (KLOR-CON  M) 20 MEQ tablet Take 1 tablet (20 mEq total) by mouth 2 (two) times daily for 7 days. 08/10/23 08/17/23  Theophilus Andrews, Tully GRADE, MD  valsartan -hydrochlorothiazide  (DIOVAN -HCT) 160-25 MG  tablet Take 1 tablet by mouth daily. 08/08/23   Theophilus Andrews, Tully GRADE, MD  Vitamin D , Ergocalciferol , (DRISDOL ) 1.25 MG (50000 UNIT) CAPS capsule Take 1 capsule (50,000 Units total) by mouth every 7 (seven) days for 12 doses. 08/10/23 10/27/23  Theophilus Andrews, Tully GRADE, MD    Allergies: Cranberry    Review of Systems  Constitutional:  Positive for fatigue. Negative for fever.  Respiratory:  Negative for chest tightness and shortness of breath.   Cardiovascular:  Negative for chest pain and leg swelling.  Gastrointestinal:  Negative for abdominal pain, diarrhea and vomiting.  Musculoskeletal:  Negative for neck pain and neck stiffness.  Skin:  Negative for rash.  Neurological:  Positive for headaches.    Updated Vital Signs BP (!) 167/111   Pulse 72   Temp 97.8 F (36.6 C) (Oral)   Resp 15   Ht 5' 3 (1.6 m)   Wt 103 kg   SpO2 99%   BMI 40.22 kg/m   Physical Exam Vitals and nursing note reviewed.  Constitutional:      General: He is not in acute distress.    Appearance: Normal appearance.  HENT:     Head: Normocephalic.     Mouth/Throat:     Mouth: Mucous membranes are moist.   Eyes:     Extraocular Movements: Extraocular movements intact.    Cardiovascular:     Rate and Rhythm: Normal rate.  Pulmonary:     Effort: Pulmonary effort is normal. No respiratory distress.  Abdominal:     Palpations: Abdomen is soft.     Tenderness: There is no abdominal tenderness.   Musculoskeletal:     Cervical back: Neck supple. No rigidity.   Skin:    General: Skin is warm.   Neurological:     Mental Status: He is alert and oriented to person, place, and time. Mental status is at baseline.     Cranial Nerves: No cranial nerve deficit, dysarthria or facial asymmetry.     Motor: No weakness.   Psychiatric:        Mood and Affect: Mood normal.     (all labs ordered are listed, but only abnormal results are displayed) Labs Reviewed  COMPREHENSIVE METABOLIC PANEL WITH  GFR - Abnormal; Notable for the following components:      Result Value   Potassium 2.9 (*)    Glucose, Bld 204 (*)    Creatinine, Ser 1.54 (*)    Calcium  8.5 (*)    Albumin 3.3 (*)    GFR, Estimated 56 (*)    All other components within normal limits  CBC WITH DIFFERENTIAL/PLATELET    EKG: None  Radiology: No results found.   Procedures   Medications Ordered in the ED  potassium chloride  SA (KLOR-CON  M) CR tablet 40 mEq (has no administration in time range)  morphine (PF) 4 MG/ML injection 4 mg (4 mg Intravenous Given 09/28/23 1313)                                    Medical Decision Making Amount and/or Complexity of Data Reviewed Labs: ordered. Radiology: ordered.  Risk Prescription drug management.   48 year old male presents emergency department with concern for generalized headache as well as high blood pressure for the past couple days.  Was seen at urgent care and sent here for concern of HTN/headache.  He is hypertensive on arrival but otherwise stable vitals.  Complaining of a generalized headache but otherwise neuro intact.  No chest pain or shortness of breath.  EKG shows no acutely ischemic changes.  CT of the head is unremarkable.  Blood work shows mild hypokalemia but no other acute abnormalities.  Creatinine is elevated but baseline.  On reevaluation blood pressure has naturally down trended to 160-70 systolic without treatment.  Will plan to treat headache and reevaluate.  On reevaluation headache is improved but not resolved, he is asking for more medication.  Also his blood pressure has elevated again to around 200 systolic.  Will address his as well.  Patient is pending reevaluation but if improved I anticipate discharge home.  Did discuss increasing his Coreg  dose for better blood pressure control and he understands and agrees.     Final diagnoses:  None    ED Discharge Orders     None          Bari Roxie HERO, DO 09/28/23 1652

## 2023-09-28 NOTE — Discharge Instructions (Addendum)
 You have been seen and discharged from the emergency department.  You were seen for high blood pressure and headache.  Your workup showed mildly low potassium and elevated blood pressure.  You were treated for headache and the above.  Stay well-hydrated.  Take the new increased dose of carvedilol  as prescribed.  Follow-up with your primary provider for re evaluation of your blood pressure and further care. Take home medications as prescribed. If you have any worsening symptoms or further concerns for your health please return to an emergency department for further evaluation.

## 2023-09-28 NOTE — ED Provider Notes (Signed)
 [facility] - URGENT CARE CENTER  Note:  This document was prepared using Conservation officer, historic buildings and may include unintentional dictation errors.  MRN: [id number] DOB: January 25, 1976  Subjective:   Chris Harrington is a 48 y.o. male presenting for acute onset of a severe headache over the frontal temporal sides that started last night.  Patient has been taking Tylenol  consistently with minimal relief.  Denies confusion but he does have blurred vision.  No chest pain, shortness of breath, wheezing, nausea, vomiting, abdominal pain, weakness, numbness or tingling, hematuria.  No history of stroke, MI.  He is compliant with his blood pressure medications.  No current facility-administered medications for this encounter.  Current Outpatient Medications:    Accu-Chek Softclix Lancets lancets, Use once daily for glucose control with Accu Chek Aviva. 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., Disp: 100 each, Rfl: 12   atorvastatin  (LIPITOR) 40 MG tablet, Take 1 tablet (40 mg total) by mouth daily., Disp: 90 tablet, Rfl: 1   carvedilol  (COREG ) 3.125 MG tablet, Take 1 tablet (3.125 mg total) by mouth 2 (two) times daily with a meal., Disp: 180 tablet, Rfl: 1   dapagliflozin  propanediol (FARXIGA ) 5 MG TABS tablet, Take 1 tablet (5 mg total) by mouth daily before breakfast., Disp: 90 tablet, Rfl: 1   ibuprofen  (ADVIL ) 800 MG tablet, Take 1 tablet (800 mg total) by mouth 3 (three) times daily., Disp: 21 tablet, Rfl: 0   potassium chloride  SA (KLOR-CON  M) 20 MEQ tablet, Take 1 tablet (20 mEq total) by mouth 2 (two) times daily for 7 days., Disp: 14 tablet, Rfl: 0   valsartan -hydrochlorothiazide  (DIOVAN -HCT) 160-25 MG tablet, Take 1 tablet by mouth daily., Disp: 90 tablet, Rfl: 1   Vitamin D , Ergocalciferol , (DRISDOL ) 1.25 MG (50000 UNIT) CAPS capsule, Take 1 capsule (50,000 Units total) by mouth every 7 (seven) days for 12 doses., Disp: 12 capsule, Rfl: 0   Allergies  Allergen Reactions   Cranberry Swelling    Past Medical  History:  Diagnosis Date   Hypertension    Obesity      Past Surgical History:  Procedure Laterality Date   TONSILECTOMY, ADENOIDECTOMY, BILATERAL MYRINGOTOMY AND TUBES     pt has not had tubes in ears    Family History  Problem Relation Age of Onset   Hypertension Mother    Diabetes Mother    Heart disease Mother    Stroke Mother    Hypertension Father    Cancer Paternal Grandmother        passed from unknown cancer   Hypertension Maternal Grandmother    Hypertension Maternal Grandfather     Social History   Tobacco Use   Smoking status: Never   Smokeless tobacco: Never  Substance Use Topics   Alcohol use: Yes    Comment: occasional   Drug use: No    ROS   Objective:   Vitals: BP (!) 187/127 (BP Location: Right Arm)   Pulse 72   Temp 98.3 F (36.8 C) (Oral)   Resp 16   SpO2 97%   BP Readings from Last 3 Encounters:  09/28/23 (!) 187/127  08/08/23 (!) 160/98  12/01/22 133/84   Physical Exam Constitutional:      General: He is not in acute distress.    Appearance: Normal appearance. He is well-developed and normal weight. He is not ill-appearing, toxic-appearing or diaphoretic.  HENT:     Head: Normocephalic and atraumatic.     Right Ear: External ear normal.     Left Ear: External ear  normal.     Nose: Nose normal.     Mouth/Throat:     Mouth: Mucous membranes are moist.   Eyes:     General: No scleral icterus.       Right eye: No discharge.        Left eye: No discharge.     Extraocular Movements: Extraocular movements intact.     Pupils: Pupils are equal, round, and reactive to light.   Neck:     Meningeal: Brudzinski's sign and Kernig's sign absent.   Cardiovascular:     Rate and Rhythm: Normal rate and regular rhythm.     Heart sounds: Normal heart sounds. No murmur heard.    No friction rub. No gallop.  Pulmonary:     Effort: Pulmonary effort is normal. No respiratory distress.     Breath sounds: Normal breath sounds. No stridor.  No wheezing, rhonchi or rales.   Musculoskeletal:     Cervical back: Normal range of motion.   Neurological:     Mental Status: He is alert and oriented to person, place, and time.     Cranial Nerves: No cranial nerve deficit, dysarthria or facial asymmetry.     Motor: No weakness, abnormal muscle tone or pronator drift.     Coordination: Romberg sign negative. Coordination normal. Finger-Nose-Finger Test and Heel to [facility] Test normal. Rapid alternating movements normal.     Gait: Gait normal.     Deep Tendon Reflexes: Reflexes normal.     Comments: Negative Romberg and pronator drift.   Psychiatric:        Mood and Affect: Mood normal.        Behavior: Behavior normal.        Thought Content: Thought content normal.        Judgment: Judgment normal.     Assessment and Plan :   PDMP not reviewed this encounter.  1. Bad headache   2. Hypertensive urgency   3. Elevated blood pressure reading in office with diagnosis of hypertension    Patient is in need of a higher level of care than we can provide in the urgent care setting.  Given his severe headache not responding to Tylenol  and severely elevated blood pressure, patient has hypertensive urgency.  This will require rule out of an acute encephalopathy secondary to his severely elevated blood pressure.  I discussed this with the patient and he is agreeable to present to the emergency room by personal vehicle.  Case reported out to EMS, transfer of care complete.   Christopher Savannah, PA-C 09/28/23 1003

## 2023-09-28 NOTE — ED Provider Notes (Signed)
 5:00 PM Assumed care of patient from off-going team. For more details, please see note from same day.  In brief, this is a 48 y.o. male recently diagnosed w/ HTN on coreg , presented with BP 204/134 which naturally downtrended to 160s-170s systolic.   Plan/Dispo at time of sign-out & ED Course since sign-out: [ ]  reeval after meds, IV hydral -Coreg  increased to BID  BP (!) 184/108 (BP Location: Left Arm)   Pulse 69   Temp 97.8 F (36.6 C) (Oral)   Resp 20   Ht 5' 3 (1.6 m)   Wt 103 kg   SpO2 99%   BMI 40.22 kg/m    ED Course:   Clinical Course as of 09/28/23 2225  Wed Sep 28, 2023  1803 BP(!): 184/119 [HN]  1839 BP(!): 176/117 [HN]  1924 Patient reevaluated.  Still has 9 out of 10 headache and his blood pressure is 206/110 systolic.  Patient had received dexamethasone , Toradol , and morphine for his headache earlier.  Will complete his headache cocktail with Tylenol , Compazine , Benadryl , and magnesium.  The hydralazine worked for him for a little while earlier.  Will switch classes to see if labetalol will work better for him and reevaluate. [HN]  2011 Patient stating magnesium is making him feel 'funny' and refused further Mg infusion [HN]  2130 Patient with consistent blood pressures greater than 210 systolic almost immediately after the IV push dose of blood pressure medicines wears off.  He does drop his blood pressure appropriately in response to the IV push dose down into the 160s-170s systolic, which is a good goal for him at this point, but immediately jumps back up into the 200s systolic.  He states he feels funny and unwell.  He does not have a headache anymore but his blood pressure is still elevated.  He also appears anxious and is pacing around the room.  Will give him a small dose of IV Ativan and start an IV nitroglycerin drip for hypertensive urgency.  Patient will need admission for his blood pressure control. [HN]  2222 Admitted to Dr. Marcene [HN]    Clinical  Course User Index [HN] Franklyn Sid SAILOR, MD    Dispo: Admit to medicine ------------------------------- Sid Franklyn, MD Emergency Medicine  This note was created using dictation software, which may contain spelling or grammatical errors.  CRITICAL CARE Performed by: Sid SAILOR Franklyn Total critical care time: 50 minutes Critical care time was exclusive of separately billable procedures and treating other patients. Critical care was necessary to treat or prevent imminent or life-threatening deterioration. Critical care was time spent personally by me on the following activities: development of treatment plan with patient and/or surrogate as well as nursing, discussions with consultants, evaluation of patient's response to treatment, examination of patient, obtaining history from patient or surrogate, ordering and performing treatments and interventions, ordering and review of laboratory studies, ordering and review of radiographic studies, pulse oximetry and re-evaluation of patient's condition.    Franklyn Sid SAILOR, MD 09/28/23 2226

## 2023-09-28 NOTE — ED Triage Notes (Signed)
 Patient BIB EMS c/o headache x 2 days. Patient report taking tylenol  without relief. Patient was at [facility] and [facility] called EMS for patient elevated BP (220/110). Patient report taking BP medication today.  Patient c/o nausea denies vomiting.  CBG 228 BP  HR 78 O2sat 98% on RA

## 2023-09-28 NOTE — ED Notes (Signed)
 Pt refused to complete magnesium sulfate, states its making me feel hot and funny

## 2023-09-28 NOTE — Progress Notes (Signed)
  Carryover admission to the Day Admitter.  I discussed this case with the EDP, Ileana Eck, PA.  Per these discussions:   This is a 48 year old male who is being admitted for left-sided facial cellulitis associated with small abscess, refractory to outpatient biotics.  She was diagnosed with left-sided facial cellulitis after presenting to the ED 1 week ago.  She was initially started on Keflex  for this, but return to the ED 4 days ago, noting no interval improvement in left-sided facial erythema with good interval compliance on Keflex .  At that time, she was started on clindamycin , and in spite of good interval compliance with clindamycin , has noted interval worsening in erythema, discomfort associated with the left-sided facial cellulitis.  Imaging today included CT maxillofacial, which is suggestive of facial cellulitis involving the left infraorbital soft tissues along the left nasolabial fold , with suggestion of  an associated 1 cm abscess, without orbital involvement.   EDP discussed patient's case with on-call ENT, Dr. Llewellyn, who requested [facility] admission to [facility], and conveyed that ENT will formally consult and see pt in the AM at [facility], requesting interval dexamethasone 8 mg IV every 8 hours, warm compresses, broad-spectrum IV antibiotics. Given the relatively small nature of the patient's abscess, Dr. Llewellyn did not anticipate surgical drainage of the abscess.   Started on IV vancomycin x 1 and Rocephin x 1 dose, and EDP placed order for dexamethasone 8 mg IV every 8 hours, along with orders for warm compresses to the left portion of the face.  I have placed an order for inpatient admission to med/tele at [facility], as requested by ENT.  I have placed some additional preliminary admit orders via the adult multi-morbid admission order set. I have also ordered continuous lactated Ringer 's at 100 cc/h x 12 hours, prn IV fentanyl.  I have continued IV vancomycin and added cefepime.   Additionally, have ordered morning labs include CMP, CBC, magnesium level.    Eva Pore, DO Hospitalist

## 2023-09-29 ENCOUNTER — Other Ambulatory Visit (HOSPITAL_COMMUNITY): Payer: Self-pay

## 2023-09-29 ENCOUNTER — Telehealth: Payer: Self-pay

## 2023-09-29 LAB — CBC WITH DIFFERENTIAL/PLATELET
Abs Immature Granulocytes: 0.05 10*3/uL (ref 0.00–0.07)
Basophils Absolute: 0 10*3/uL (ref 0.0–0.1)
Basophils Relative: 0 %
Eosinophils Absolute: 0 10*3/uL (ref 0.0–0.5)
Eosinophils Relative: 0 %
HCT: 45.5 % (ref 39.0–52.0)
Hemoglobin: 15.3 g/dL (ref 13.0–17.0)
Immature Granulocytes: 1 %
Lymphocytes Relative: 11 %
Lymphs Abs: 1.1 10*3/uL (ref 0.7–4.0)
MCH: 29.1 pg (ref 26.0–34.0)
MCHC: 33.6 g/dL (ref 30.0–36.0)
MCV: 86.5 fL (ref 80.0–100.0)
Monocytes Absolute: 0.2 10*3/uL (ref 0.1–1.0)
Monocytes Relative: 2 %
Neutro Abs: 8.3 10*3/uL — ABNORMAL HIGH (ref 1.7–7.7)
Neutrophils Relative %: 86 %
Platelets: 290 10*3/uL (ref 150–400)
RBC: 5.26 MIL/uL (ref 4.22–5.81)
RDW: 13.3 % (ref 11.5–15.5)
WBC: 9.6 10*3/uL (ref 4.0–10.5)
nRBC: 0 % (ref 0.0–0.2)

## 2023-09-29 LAB — COMPREHENSIVE METABOLIC PANEL WITH GFR
ALT: 21 U/L (ref 0–44)
AST: 20 U/L (ref 15–41)
Albumin: 3.8 g/dL (ref 3.5–5.0)
Alkaline Phosphatase: 60 U/L (ref 38–126)
Anion gap: 12 (ref 5–15)
BUN: 17 mg/dL (ref 6–20)
CO2: 21 mmol/L — ABNORMAL LOW (ref 22–32)
Calcium: 9.2 mg/dL (ref 8.9–10.3)
Chloride: 103 mmol/L (ref 98–111)
Creatinine, Ser: 1.51 mg/dL — ABNORMAL HIGH (ref 0.61–1.24)
GFR, Estimated: 58 mL/min — ABNORMAL LOW (ref >60)
Glucose, Bld: 195 mg/dL — ABNORMAL HIGH (ref 70–99)
Potassium: 3.5 mmol/L (ref 3.5–5.1)
Sodium: 136 mmol/L (ref 135–145)
Total Bilirubin: 1.1 mg/dL (ref 0.0–1.2)
Total Protein: 8.4 g/dL — ABNORMAL HIGH (ref 6.5–8.1)

## 2023-09-29 LAB — MAGNESIUM: Magnesium: 2 mg/dL (ref 1.7–2.4)

## 2023-09-29 MED ORDER — NICARDIPINE HCL IN NACL 20-0.86 MG/200ML-% IV SOLN
3.0000 mg/h | INTRAVENOUS | Status: DC
  Administered 2023-09-29: 7.5 mg/h via INTRAVENOUS
  Administered 2023-09-29: 10 mg/h via INTRAVENOUS
  Administered 2023-09-29: 5 mg/h via INTRAVENOUS
  Filled 2023-09-29 (×2): qty 200

## 2023-09-29 MED ORDER — EMPAGLIFLOZIN 10 MG PO TABS: 10.0000 mg | ORAL_TABLET | Freq: Every day | ORAL | 3 refills | Status: DC

## 2023-09-29 NOTE — ED Notes (Signed)
 Patient continues to say that he would not like to get admitted, that when his BP is under control he would like to be discharge. I told him that the admitting doctors would be making rounds this morning and he can mention it to them. He is aware that if he leaves, it will be AMA. For now he is staying. He continues to take his BP cuff off, so we can not get a continues BP readings

## 2023-09-29 NOTE — ED Notes (Signed)
 Advised patient to ff-up with his PCP and take his BP medications.Patient understood the consequences of leaving AMA including death.

## 2023-09-29 NOTE — ED Notes (Signed)
 Patient continues to take BP off.

## 2023-09-29 NOTE — ED Notes (Addendum)
 Patient ambulated to bathroom.

## 2023-09-29 NOTE — Telephone Encounter (Signed)
 Pharmacy Patient Advocate Encounter   Received notification from Pt Calls Messages that prior authorization for Farxiga  5 is required/requested.   Insurance verification completed.   The patient is insured through [employer] .   Per test claim: Patient has plan benefit exclusion as this is a non formulary medication. It will not be covered.

## 2023-09-29 NOTE — ED Notes (Signed)
 Pt signed AMA form.

## 2023-09-29 NOTE — ED Notes (Addendum)
 Patient mentioned to me that He would not like to get admitted. That when his BP is low he would like to leave. Made Marcene Soulier, Provider aware

## 2023-09-29 NOTE — ED Notes (Signed)
 Dr. Celinda made aware that patient is leaving and does not want to be admitted. Patient does not want to wait for the Provider to come and talk to him.

## 2023-09-29 NOTE — Addendum Note (Signed)
 Addended by: KATHRYNE MILLMAN B on: 09/29/2023 01:10 PM   Modules accepted: Orders

## 2023-09-29 NOTE — ED Notes (Signed)
 Patient sitting in chair, appears awake, alert and oriented. Breathing even and unlabored. Appears upset stating I have been here since yesterday, they told me last night at 9 pm that I already have a bed upstairs but I have been in this room for 24 hours. I only saw 2 nurses yesterday and I did not get any help. Gabby my nurse from 3rd shift was on top of the game'

## 2023-09-29 NOTE — ED Notes (Addendum)
 Explain to patient per Provider Chris Harrington, that if he would like to leave, he would have to leave AMA, that he had to stay to have time to modify his BP regimen for safe discharge.

## 2023-10-03 ENCOUNTER — Encounter: Payer: Self-pay | Admitting: Internal Medicine

## 2023-10-03 ENCOUNTER — Ambulatory Visit: Admitting: Internal Medicine

## 2023-10-03 VITALS — BP 170/110 | HR 87 | Temp 98.0°F | Wt 224.3 lb

## 2023-10-03 DIAGNOSIS — I10 Essential (primary) hypertension: Secondary | ICD-10-CM | POA: Diagnosis not present

## 2023-10-03 DIAGNOSIS — E785 Hyperlipidemia, unspecified: Secondary | ICD-10-CM

## 2023-10-03 DIAGNOSIS — E119 Type 2 diabetes mellitus without complications: Secondary | ICD-10-CM | POA: Diagnosis not present

## 2023-10-03 MED ORDER — ATORVASTATIN CALCIUM 40 MG PO TABS: 40.0000 mg | ORAL_TABLET | Freq: Every day | ORAL | 1 refills | Status: DC

## 2023-10-03 MED ORDER — EMPAGLIFLOZIN 10 MG PO TABS
10.0000 mg | ORAL_TABLET | Freq: Every day | ORAL | 3 refills | Status: DC
Start: 2023-10-03 — End: 2023-11-21

## 2023-10-03 MED ORDER — VALSARTAN-HYDROCHLOROTHIAZIDE 160-25 MG PO TABS: 1.0000 | ORAL_TABLET | Freq: Every day | ORAL | 1 refills | Status: DC

## 2023-10-03 MED ORDER — CARVEDILOL 6.25 MG PO TABS: 6.2500 mg | ORAL_TABLET | Freq: Two times a day (BID) | ORAL | 0 refills | Status: DC

## 2023-10-03 NOTE — Progress Notes (Signed)
 Established Patient Office Visit     CC/Reason for Visit: ED follow-up, follow-up chronic medical conditions  HPI: Chris Harrington is a 48 y.o. male who is coming in today for the above mentioned reasons. Past Medical History is significant for: Hypertension, type 2 diabetes, hyperlipidemia, morbid obesity, vitamin D  deficiency.  Last visit we added carvedilol  to his Diovan  HCT.  Unfortunately he discontinued the Diovan  when he started the carvedilol .  This led to hypertensive urgency with headache he was seen in the emergency department and admission was recommended but he refused.  His blood pressure today is 170/110.   Past Medical/Surgical History: Past Medical History:  Diagnosis Date   Hypertension    Obesity     Past Surgical History:  Procedure Laterality Date   TONSILECTOMY, ADENOIDECTOMY, BILATERAL MYRINGOTOMY AND TUBES     pt has not had tubes in ears    Social History:  reports that he has never smoked. He has never used smokeless tobacco. He reports current alcohol use. He reports that he does not use drugs.  Allergies: Allergies  Allergen Reactions   Cranberry Swelling    Family History:  Family History  Problem Relation Age of Onset   Hypertension Mother    Diabetes Mother    Heart disease Mother    Stroke Mother    Hypertension Father    Cancer Paternal Grandmother        passed from unknown cancer   Hypertension Maternal Grandmother    Hypertension Maternal Grandfather      Current Outpatient Medications:    Accu-Chek Softclix Lancets lancets, Use once daily for glucose control with Accu Chek Aviva. DxE11.9., Disp: 100 each, Rfl: 12   ibuprofen  (ADVIL ) 800 MG tablet, Take 1 tablet (800 mg total) by mouth 3 (three) times daily., Disp: 21 tablet, Rfl: 0   atorvastatin  (LIPITOR) 40 MG tablet, Take 1 tablet (40 mg total) by mouth daily., Disp: 90 tablet, Rfl: 1   carvedilol  (COREG ) 6.25 MG tablet, Take 1 tablet (6.25 mg total) by mouth 2 (two)  times daily with a meal., Disp: 60 tablet, Rfl: 0   empagliflozin  (JARDIANCE ) 10 MG TABS tablet, Take 1 tablet (10 mg total) by mouth daily., Disp: 90 tablet, Rfl: 3   potassium chloride  SA (KLOR-CON  M) 20 MEQ tablet, Take 1 tablet (20 mEq total) by mouth 2 (two) times daily for 7 days. (Patient not taking: Reported on 09/28/2023), Disp: 14 tablet, Rfl: 0   valsartan -hydrochlorothiazide  (DIOVAN -HCT) 160-25 MG tablet, Take 1 tablet by mouth daily., Disp: 90 tablet, Rfl: 1   Vitamin D , Ergocalciferol , (DRISDOL ) 1.25 MG (50000 UNIT) CAPS capsule, Take 1 capsule (50,000 Units total) by mouth every 7 (seven) days for 12 doses. (Patient not taking: Reported on 09/28/2023), Disp: 12 capsule, Rfl: 0  Review of Systems:  Negative unless indicated in HPI.   Physical Exam: Vitals:   10/03/23 0704  BP: (!) 170/110  Pulse: 87  Temp: 98 F (36.7 C)  TempSrc: Oral  SpO2: (!) 87%  Weight: 224 lb 4.8 oz (101.7 kg)    Body mass index is 39.73 kg/m.   Physical Exam Vitals reviewed.  Constitutional:      Appearance: Normal appearance. He is obese.  HENT:     Head: Normocephalic and atraumatic.   Eyes:     Conjunctiva/sclera: Conjunctivae normal.    Cardiovascular:     Rate and Rhythm: Normal rate and regular rhythm.  Pulmonary:     Effort: Pulmonary effort is  normal.     Breath sounds: Normal breath sounds.   Skin:    General: Skin is warm and dry.   Neurological:     General: No focal deficit present.     Mental Status: He is alert and oriented to person, place, and time.   Psychiatric:        Mood and Affect: Mood normal.        Behavior: Behavior normal.        Thought Content: Thought content normal.        Judgment: Judgment normal.      Impression and Plan:  Type 2 diabetes mellitus without complication, without long-term current use of insulin  (HCC) -     Empagliflozin ; Take 1 tablet (10 mg total) by mouth daily.  Dispense: 90 tablet; Refill: 3  Dyslipidemia -      Atorvastatin  Calcium ; Take 1 tablet (40 mg total) by mouth daily.  Dispense: 90 tablet; Refill: 1  Primary hypertension -     Valsartan -hydroCHLOROthiazide ; Take 1 tablet by mouth daily.  Dispense: 90 tablet; Refill: 1  Other orders -     Carvedilol ; Take 1 tablet (6.25 mg total) by mouth 2 (two) times daily with a meal.  Dispense: 60 tablet; Refill: 0   - Resume Diovan  HCT, continue Coreg  6.25 twice daily. - Resume atorvastatin  for hyperlipidemia. - Sent for Jardiance  as his insurance had a formulary exclusion for Farxiga .  Time spent:31 minutes reviewing chart, interviewing and examining patient and formulating plan of care.     Tully Theophilus Andrews, MD [facility] at [facility]

## 2023-10-24 ENCOUNTER — Telehealth: Payer: Self-pay

## 2023-10-24 ENCOUNTER — Other Ambulatory Visit (HOSPITAL_COMMUNITY): Payer: Self-pay

## 2023-10-24 NOTE — Telephone Encounter (Signed)
 Pharmacy Patient Advocate Encounter   Received notification from Pt Calls Messages that prior authorization for Jardiance  10 is required/requested.   Insurance verification completed.   The patient is insured through [employer] .   Per test claim: PA required; PA submitted to above mentioned insurance via CoverMyMeds Key/confirmation #/EOC [id number] Status is pending

## 2023-10-26 ENCOUNTER — Telehealth: Payer: Self-pay | Admitting: Pharmacist

## 2023-10-26 ENCOUNTER — Other Ambulatory Visit (HOSPITAL_COMMUNITY): Payer: Self-pay

## 2023-10-26 NOTE — Telephone Encounter (Signed)
 Appeal has been submitted for Jardiance . Will advise when response is received, please be advised that most companies may take 30 days to make a decision. Appeal letter and supporting documentation have been faxed to [phone] on 10/26/2023 @4 :36 pm.  Thank you, Devere Pandy, PharmD Clinical Pharmacist  [place]  Direct Dial: [phone]

## 2023-10-26 NOTE — Telephone Encounter (Signed)
 Pharmacy Patient Advocate Encounter  Received notification from [employer] that Prior Authorization for Jardiance  10 has been DENIED.  Full denial letter will be uploaded to the media tab. See denial reason below. Routing to appeals team since patient unable to swallow metformin .   PA #/Case ID/Reference #: [id number]

## 2023-10-26 NOTE — Telephone Encounter (Signed)
 Pharmacy Patient Advocate Encounter  Received notification from [employer] that Prior Authorization for Jardiance  10  has been DENIED.  Full denial letter will be uploaded to the media tab. See denial reason below.   PA #/Case ID/Reference #: [id number]

## 2023-11-04 ENCOUNTER — Other Ambulatory Visit (HOSPITAL_COMMUNITY): Payer: Self-pay

## 2023-11-08 ENCOUNTER — Other Ambulatory Visit: Payer: Self-pay | Admitting: Internal Medicine

## 2023-11-08 ENCOUNTER — Encounter: Payer: Self-pay | Admitting: Internal Medicine

## 2023-11-08 ENCOUNTER — Ambulatory Visit: Payer: Self-pay | Admitting: Internal Medicine

## 2023-11-08 VITALS — BP 159/100 | HR 71 | Temp 98.1°F | Wt 229.6 lb

## 2023-11-08 DIAGNOSIS — Z7985 Long-term (current) use of injectable non-insulin antidiabetic drugs: Secondary | ICD-10-CM

## 2023-11-08 DIAGNOSIS — E785 Hyperlipidemia, unspecified: Secondary | ICD-10-CM | POA: Diagnosis not present

## 2023-11-08 DIAGNOSIS — I10 Essential (primary) hypertension: Secondary | ICD-10-CM | POA: Diagnosis not present

## 2023-11-08 DIAGNOSIS — E119 Type 2 diabetes mellitus without complications: Secondary | ICD-10-CM

## 2023-11-08 DIAGNOSIS — G4733 Obstructive sleep apnea (adult) (pediatric): Secondary | ICD-10-CM

## 2023-11-08 LAB — POCT GLYCOSYLATED HEMOGLOBIN (HGB A1C): Hemoglobin A1C: 8.2 % — AB (ref 4.0–5.6)

## 2023-11-08 MED ORDER — TIRZEPATIDE 2.5 MG/0.5ML ~~LOC~~ SOAJ: 2.5000 mg | SUBCUTANEOUS | 0 refills | Status: DC

## 2023-11-08 MED ORDER — CARVEDILOL 25 MG PO TABS
25.0000 mg | ORAL_TABLET | Freq: Two times a day (BID) | ORAL | 1 refills | Status: AC
Start: 2023-11-08 — End: ?

## 2023-11-08 NOTE — Progress Notes (Signed)
 Established Patient Office Visit     CC/Reason for Visit: Follow-up chronic conditions  HPI: Chris Harrington is a 48 y.o. male who is coming in today for the above mentioned reasons. Past Medical History is significant for: Hypertension, diabetes, hyperlipidemia, morbid obesity, stage III chronic kidney disease, vitamin D  deficiency.  He has not yet been able to start Jardiance  as his insurance is going through an appeal process.  His blood pressure remains elevated despite taking 12.5 mg twice daily of Coreg  and Diovan  HCT 160 over 25 mg.  He is concerned he might have sleep apnea.  He snores frequently, has frequent nighttime awakenings and feels very fatigued throughout the day.   Past Medical/Surgical History: Past Medical History:  Diagnosis Date   Hypertension    Obesity     Past Surgical History:  Procedure Laterality Date   TONSILECTOMY, ADENOIDECTOMY, BILATERAL MYRINGOTOMY AND TUBES     pt has not had tubes in ears    Social History:  reports that he has never smoked. He has never used smokeless tobacco. He reports current alcohol use. He reports that he does not use drugs.  Allergies: Allergies  Allergen Reactions   Cranberry Swelling    Family History:  Family History  Problem Relation Age of Onset   Hypertension Mother    Diabetes Mother    Heart disease Mother    Stroke Mother    Hypertension Father    Cancer Paternal Grandmother        passed from unknown cancer   Hypertension Maternal Grandmother    Hypertension Maternal Grandfather      Current Outpatient Medications:    Accu-Chek Softclix Lancets lancets, Use once daily for glucose control with Accu Chek Aviva. DxE11.9., Disp: 100 each, Rfl: 12   atorvastatin  (LIPITOR) 40 MG tablet, Take 1 tablet (40 mg total) by mouth daily., Disp: 90 tablet, Rfl: 1   carvedilol  (COREG ) 25 MG tablet, Take 1 tablet (25 mg total) by mouth 2 (two) times daily with a meal., Disp: 180 tablet, Rfl: 1   tirzepatide   (MOUNJARO ) 2.5 MG/0.5ML Pen, Inject 2.5 mg into the skin once a week., Disp: 2 mL, Rfl: 0   valsartan -hydrochlorothiazide  (DIOVAN -HCT) 160-25 MG tablet, Take 1 tablet by mouth daily., Disp: 90 tablet, Rfl: 1   empagliflozin  (JARDIANCE ) 10 MG TABS tablet, Take 1 tablet (10 mg total) by mouth daily. (Patient not taking: Reported on 11/08/2023), Disp: 90 tablet, Rfl: 3   ibuprofen  (ADVIL ) 800 MG tablet, Take 1 tablet (800 mg total) by mouth 3 (three) times daily., Disp: 21 tablet, Rfl: 0   potassium chloride  SA (KLOR-CON  M) 20 MEQ tablet, Take 1 tablet (20 mEq total) by mouth 2 (two) times daily for 7 days. (Patient not taking: Reported on 09/28/2023), Disp: 14 tablet, Rfl: 0  Review of Systems:  Negative unless indicated in HPI.   Physical Exam: Vitals:   11/08/23 1359 11/08/23 1404  BP: (!) 140/90 (!) 159/100  Pulse: 71   Temp: 98.1 F (36.7 C)   TempSrc: Oral   SpO2: 95%   Weight: 229 lb 9.6 oz (104.1 kg)     Body mass index is 40.67 kg/m.   Physical Exam Vitals reviewed.  Constitutional:      Appearance: Normal appearance. He is obese.  HENT:     Head: Normocephalic and atraumatic.  Eyes:     Conjunctiva/sclera: Conjunctivae normal.  Cardiovascular:     Rate and Rhythm: Normal rate and regular rhythm.  Pulmonary:  Effort: Pulmonary effort is normal.     Breath sounds: Normal breath sounds.  Skin:    General: Skin is warm and dry.  Neurological:     General: No focal deficit present.     Mental Status: He is alert and oriented to person, place, and time.  Psychiatric:        Mood and Affect: Mood normal.        Behavior: Behavior normal.        Thought Content: Thought content normal.        Judgment: Judgment normal.      Impression and Plan:  Type 2 diabetes mellitus without complication, without long-term current use of insulin  (HCC) -     POCT glycosylated hemoglobin (Hb A1C) -     Tirzepatide ; Inject 2.5 mg into the skin once a week.  Dispense: 2 mL;  Refill: 0  Primary hypertension -     Carvedilol ; Take 1 tablet (25 mg total) by mouth 2 (two) times daily with a meal.  Dispense: 180 tablet; Refill: 1  Morbid obesity (HCC)  Dyslipidemia  OSA (obstructive sleep apnea) -     Ambulatory referral to Neurology   - Refer to [facility] neurology as he has probable sleep apnea for sleep study. - Blood pressure remains uncontrolled, continue Diovan  HCT and increase Coreg  to 25 mg twice daily. - A1c has increased to 8.2.  Start Mounjaro  2.5 mg daily.  He has failed Trulicity  in the past as it did not improve his A1c, so Trulicity  is not an option.  Will also help with weight loss. - Check lipids next visit.  On atorvastatin  40 mg.  Time spent:31 minutes reviewing chart, interviewing and examining patient and formulating plan of care.     Tully Theophilus Andrews, MD [facility] at [facility]

## 2023-11-09 ENCOUNTER — Other Ambulatory Visit: Payer: Self-pay | Admitting: Internal Medicine

## 2023-11-09 DIAGNOSIS — E119 Type 2 diabetes mellitus without complications: Secondary | ICD-10-CM

## 2023-11-11 ENCOUNTER — Other Ambulatory Visit (HOSPITAL_COMMUNITY): Payer: Self-pay

## 2023-11-11 NOTE — Telephone Encounter (Signed)
 Based on test claim, insurance has approved Jardiance :

## 2023-11-17 ENCOUNTER — Other Ambulatory Visit (HOSPITAL_COMMUNITY): Payer: Self-pay

## 2023-11-17 ENCOUNTER — Telehealth: Payer: Self-pay

## 2023-11-17 NOTE — Telephone Encounter (Signed)
 Pharmacy Patient Advocate Encounter   Received notification from Pt Calls Messages that prior authorization for Mounjaro  2.5MG /0.5ML auto-injectors is required/requested.   Insurance verification completed.   The patient is insured through [facility] .   Per test claim: PA required; PA submitted to above mentioned insurance via Latent Key/confirmation #/EOC [id number] Status is pending

## 2023-11-17 NOTE — Telephone Encounter (Signed)
 Noted

## 2023-11-17 NOTE — Telephone Encounter (Signed)
 Copied from CRM [id number]. Topic: Referral - Prior Authorization Question >> Nov 16, 2023  4:15 PM Lavanda D wrote: Reason for CRM: Madeline with [employer] is calling to verify fax number as his mounjaro  is requiring a Prior Authorization. Key #: key.covermymeds.com [id number] - She will be faxing over the request.

## 2023-11-17 NOTE — Telephone Encounter (Signed)
 Documented 11/17/23

## 2023-11-18 ENCOUNTER — Other Ambulatory Visit (HOSPITAL_COMMUNITY): Payer: Self-pay

## 2023-11-18 ENCOUNTER — Encounter: Payer: Self-pay | Admitting: Internal Medicine

## 2023-11-18 DIAGNOSIS — E119 Type 2 diabetes mellitus without complications: Secondary | ICD-10-CM

## 2023-11-21 ENCOUNTER — Other Ambulatory Visit (HOSPITAL_COMMUNITY): Payer: Self-pay

## 2023-11-21 MED ORDER — EMPAGLIFLOZIN 10 MG PO TABS: 10.0000 mg | ORAL_TABLET | Freq: Every day | ORAL | 1 refills | Status: DC

## 2023-11-21 NOTE — Telephone Encounter (Signed)
 Pharmacy Patient Advocate Encounter  Received notification from [facility] that Prior Authorization for Mounjaro  2.5MG /0.5ML auto-injectors  has been DENIED.  Full denial letter will be uploaded to the media tab. See denial reason below.   PA #/Case ID/Reference #: [id number]

## 2023-11-21 NOTE — Telephone Encounter (Signed)
 Trulicity  was listed as one of the medications he has failed. Documentation was also submitted. Per denial letter, patient must try up to 3 preferred products (liraglutide, Rybelsus, Trulicity ), or provide medical reason why patient cannot take those other products.

## 2023-11-21 NOTE — Telephone Encounter (Signed)
 Included Trulicity  fail and current A1C  Pharmacy Patient Advocate Encounter   Received notification from Physician's Office that prior authorization for Mounjaro  2.5 is required/requested.   Insurance verification completed.   The patient is insured through [facility] .   Per test claim: PA required; PA submitted to above mentioned insurance via Latent Key/confirmation #/EOC [id number] Status is pending

## 2023-11-22 ENCOUNTER — Other Ambulatory Visit (HOSPITAL_COMMUNITY): Payer: Self-pay

## 2024-01-13 ENCOUNTER — Emergency Department (HOSPITAL_BASED_OUTPATIENT_CLINIC_OR_DEPARTMENT_OTHER)
Admission: EM | Admit: 2024-01-13 | Discharge: 2024-01-13 | Disposition: A | Discharge status: Home / Self Care | Attending: Emergency Medicine | Admitting: Emergency Medicine

## 2024-01-13 ENCOUNTER — Other Ambulatory Visit: Payer: Self-pay

## 2024-01-13 ENCOUNTER — Encounter (HOSPITAL_BASED_OUTPATIENT_CLINIC_OR_DEPARTMENT_OTHER): Payer: Self-pay | Admitting: Emergency Medicine

## 2024-01-13 DIAGNOSIS — E876 Hypokalemia: Secondary | ICD-10-CM | POA: Diagnosis not present

## 2024-01-13 DIAGNOSIS — Z794 Long term (current) use of insulin: Secondary | ICD-10-CM | POA: Insufficient documentation

## 2024-01-13 DIAGNOSIS — E860 Dehydration: Secondary | ICD-10-CM | POA: Insufficient documentation

## 2024-01-13 DIAGNOSIS — Z79899 Other long term (current) drug therapy: Secondary | ICD-10-CM | POA: Diagnosis not present

## 2024-01-13 DIAGNOSIS — I10 Essential (primary) hypertension: Secondary | ICD-10-CM | POA: Insufficient documentation

## 2024-01-13 DIAGNOSIS — E1165 Type 2 diabetes mellitus with hyperglycemia: Secondary | ICD-10-CM | POA: Insufficient documentation

## 2024-01-13 DIAGNOSIS — R739 Hyperglycemia, unspecified: Secondary | ICD-10-CM | POA: Diagnosis present

## 2024-01-13 DIAGNOSIS — Z7984 Long term (current) use of oral hypoglycemic drugs: Secondary | ICD-10-CM | POA: Insufficient documentation

## 2024-01-13 LAB — CBC
HCT: 44 % (ref 39.0–52.0)
Hemoglobin: 14.8 g/dL (ref 13.0–17.0)
MCH: 29 pg (ref 26.0–34.0)
MCHC: 33.6 g/dL (ref 30.0–36.0)
MCV: 86.3 fL (ref 80.0–100.0)
Platelets: 265 K/uL (ref 150–400)
RBC: 5.1 MIL/uL (ref 4.22–5.81)
RDW: 13 % (ref 11.5–15.5)
WBC: 6.8 K/uL (ref 4.0–10.5)
nRBC: 0 % (ref 0.0–0.2)

## 2024-01-13 LAB — CBG MONITORING, ED
Glucose-Capillary: 389 mg/dL — ABNORMAL HIGH (ref 70–99)
Glucose-Capillary: 255 mg/dL — ABNORMAL HIGH (ref 70–99)

## 2024-01-13 LAB — BASIC METABOLIC PANEL WITH GFR
Anion gap: 13 (ref 5–15)
BUN: 22 mg/dL — ABNORMAL HIGH (ref 6–20)
CO2: 29 mmol/L (ref 22–32)
Calcium: 9.2 mg/dL (ref 8.9–10.3)
Chloride: 92 mmol/L — ABNORMAL LOW (ref 98–111)
Creatinine, Ser: 1.96 mg/dL — ABNORMAL HIGH (ref 0.61–1.24)
GFR, Estimated: 42 mL/min — ABNORMAL LOW (ref >60)
Glucose, Bld: 398 mg/dL — ABNORMAL HIGH (ref 70–99)
Potassium: 2.9 mmol/L — ABNORMAL LOW (ref 3.5–5.1)
Sodium: 134 mmol/L — ABNORMAL LOW (ref 135–145)

## 2024-01-13 MED ORDER — LACTATED RINGERS IV BOLUS
1000.0000 mL | Freq: Once | INTRAVENOUS | Status: AC
  Administered 2024-01-13: 1000 mL via INTRAVENOUS

## 2024-01-13 MED ORDER — SODIUM CHLORIDE 0.9 % IV BOLUS
1000.0000 mL | Freq: Once | INTRAVENOUS | Status: AC
  Administered 2024-01-13: 1000 mL via INTRAVENOUS

## 2024-01-13 MED ORDER — POTASSIUM CHLORIDE CRYS ER 20 MEQ PO TBCR
40.0000 meq | EXTENDED_RELEASE_TABLET | Freq: Once | ORAL | Status: AC
  Administered 2024-01-13: 40 meq via ORAL
  Filled 2024-01-13: qty 2

## 2024-01-13 NOTE — ED Provider Notes (Signed)
 [facility] Provider Note   CSN: [id number] Arrival date & time: 01/13/24  0330     Patient presents with: Hyperglycemia   Chris Harrington is a 48 y.o. male.   The history is provided by the patient.  Patient history of hypertension, diabetes, obesity presents with hyperglycemia.  Patient reports over the past several days he noted increased dry mouth and polyuria.  He reports has been taking his Jardiance  as prescribed but his glucose was still high.  He checked it at home and it was 407. No fevers or vomiting.  No chest pain or shortness of breath.  He reports he no longer qualifies for the Mounjaro  injection and is only on Jardiance .  He reports daily medication compliance    Past Medical History:  Diagnosis Date   Hypertension    Obesity     Prior to Admission medications   Medication Sig Start Date End Date Taking? Authorizing Provider  Accu-Chek Softclix Lancets lancets Use once daily for glucose control with Accu Chek Aviva. DxE11.9. 02/01/20   Theophilus Andrews, Tully GRADE, MD  atorvastatin  (LIPITOR) 40 MG tablet Take 1 tablet (40 mg total) by mouth daily. 10/03/23   Theophilus Andrews, Tully GRADE, MD  carvedilol  (COREG ) 25 MG tablet Take 1 tablet (25 mg total) by mouth 2 (two) times daily with a meal. 11/08/23   Theophilus Andrews, Tully GRADE, MD  empagliflozin  (JARDIANCE ) 10 MG TABS tablet Take 1 tablet (10 mg total) by mouth daily. 11/21/23   Theophilus Andrews, Tully GRADE, MD  tirzepatide  (MOUNJARO ) 2.5 MG/0.5ML Pen Inject 2.5 mg into the skin once a week. 11/08/23   Theophilus Andrews, Tully GRADE, MD  valsartan -hydrochlorothiazide  (DIOVAN -HCT) 160-25 MG tablet Take 1 tablet by mouth daily. 10/03/23   Theophilus Andrews, Tully GRADE, MD    Allergies: Cranberry    Review of Systems  Constitutional:  Negative for fever.  Respiratory:  Negative for shortness of breath.   Cardiovascular:  Negative for chest pain.  Gastrointestinal:  Negative for vomiting.   Endocrine: Positive for polydipsia and polyuria.    Updated Vital Signs BP 122/81   Pulse 64   Temp 98 F (36.7 C) (Oral)   Resp 17   Ht 1.575 m (5' 2)   Wt 90.7 kg   SpO2 99%   BMI 36.58 kg/m   Physical Exam CONSTITUTIONAL: Well developed/well nourished, no distress, does not smell of ketones HEAD: Normocephalic/atraumatic EYES: EOMI/PERRL ENMT: Mucous membranes dry NECK: supple no meningeal signs CV: S1/S2 noted, no murmurs/rubs/gallops noted LUNGS: Lungs are clear to auscultation bilaterally, no apparent distress ABDOMEN: soft, nontender NEURO: Pt is awake/alert/appropriate, moves all extremitiesx4.  No facial droop.   EXTREMITIES: pulses normal/equal, full ROM SKIN: warm, color normal PSYCH: no abnormalities of mood noted, alert and oriented to situation  (all labs ordered are listed, but only abnormal results are displayed) Labs Reviewed  BASIC METABOLIC PANEL WITH GFR - Abnormal; Notable for the following components:      Result Value   Sodium 134 (*)    Potassium 2.9 (*)    Chloride 92 (*)    Glucose, Bld 398 (*)    BUN 22 (*)    Creatinine, Ser 1.96 (*)    GFR, Estimated 42 (*)    All other components within normal limits  CBG MONITORING, ED - Abnormal; Notable for the following components:   Glucose-Capillary 389 (*)    All other components within normal limits  CBG MONITORING, ED - Abnormal;  Notable for the following components:   Glucose-Capillary 255 (*)    All other components within normal limits  CBC    EKG: None  Radiology: No results found.   Procedures   Medications Ordered in the ED  sodium chloride  0.9 % bolus 1,000 mL (0 mLs Intravenous Stopped 01/13/24 0502)  lactated ringers bolus 1,000 mL (0 mLs Intravenous Stopped 01/13/24 0602)  potassium chloride  SA (KLOR-CON  M) CR tablet 40 mEq (40 mEq Oral Given 01/13/24 0433)  potassium chloride  SA (KLOR-CON  M) CR tablet 40 mEq (40 mEq Oral Given 01/13/24 0554)    Clinical Course as of  01/13/24 0602  Fri Jan 13, 2024  0352 Patient w/history of diabetes and hypertension presents with hyperglycemia despite taking his Jardiance  Differential includes diabetic ketoacidosis, hyperglycemia, HHS, & euglycemic DKA  Patient is overall well-appearing.  Labs reveal hyperglycemia.  Will check anion gap, renal function and reassess.  IV fluids have been ordered [DW]  0419 Glucose(!): 398 Hyperglycemia without anion gap [DW]  0419 Potassium(!): 2.9 Hypokalemia [DW]  0419 Creatinine(!): 1.96 Worsening renal insufficiency [DW]  0602 Patient feels improved.  Glucose is improving.  No indication for admission at this time.  Patient will follow-up closely with his PCP.  Patient will continue all of his medications but will need to have labs rechecked next week with his PCP [DW]    Clinical Course User Index [DW] Midge Golas, MD                                 Medical Decision Making Amount and/or Complexity of Data Reviewed Labs: ordered. Decision-making details documented in ED Course.  Risk Prescription drug management.        Final diagnoses:  Hyperglycemia  Dehydration  Hypokalemia    ED Discharge Orders     None          Midge Golas, MD 01/13/24 [phone]

## 2024-01-13 NOTE — ED Triage Notes (Signed)
 Pt here for high glucose, 407  at home. Dry mouth, frequent urination. Trying meds at home not working.

## 2024-01-13 NOTE — ED Notes (Signed)
 Pt ambulates to restroom unassisted and with out concern.

## 2024-01-13 NOTE — ED Notes (Signed)
Urine sent to lab at this time.

## 2024-01-13 NOTE — ED Notes (Signed)
CBG 255 

## 2024-01-16 ENCOUNTER — Ambulatory Visit: Admitting: Internal Medicine

## 2024-01-16 ENCOUNTER — Encounter: Payer: Self-pay | Admitting: Internal Medicine

## 2024-01-16 VITALS — BP 110/72 | HR 95 | Temp 98.6°F | Ht 62.0 in | Wt 219.2 lb

## 2024-01-16 DIAGNOSIS — E559 Vitamin D deficiency, unspecified: Secondary | ICD-10-CM | POA: Diagnosis not present

## 2024-01-16 DIAGNOSIS — Z7984 Long term (current) use of oral hypoglycemic drugs: Secondary | ICD-10-CM

## 2024-01-16 DIAGNOSIS — N1832 Chronic kidney disease, stage 3b: Secondary | ICD-10-CM

## 2024-01-16 DIAGNOSIS — E119 Type 2 diabetes mellitus without complications: Secondary | ICD-10-CM | POA: Diagnosis not present

## 2024-01-16 DIAGNOSIS — I10 Essential (primary) hypertension: Secondary | ICD-10-CM

## 2024-01-16 DIAGNOSIS — Z6836 Body mass index (BMI) 36.0-36.9, adult: Secondary | ICD-10-CM

## 2024-01-16 DIAGNOSIS — E1169 Type 2 diabetes mellitus with other specified complication: Secondary | ICD-10-CM | POA: Diagnosis not present

## 2024-01-16 DIAGNOSIS — Z7985 Long-term (current) use of injectable non-insulin antidiabetic drugs: Secondary | ICD-10-CM

## 2024-01-16 DIAGNOSIS — E785 Hyperlipidemia, unspecified: Secondary | ICD-10-CM

## 2024-01-16 DIAGNOSIS — E66812 Obesity, class 2: Secondary | ICD-10-CM

## 2024-01-16 LAB — POCT GLYCOSYLATED HEMOGLOBIN (HGB A1C): Hemoglobin A1C: 10.2 % — AB (ref 4.0–5.6)

## 2024-01-16 MED ORDER — EMPAGLIFLOZIN 25 MG PO TABS: 25.0000 mg | ORAL_TABLET | Freq: Every day | ORAL | 1 refills | Status: AC

## 2024-01-16 MED ORDER — TRULICITY 0.75 MG/0.5ML ~~LOC~~ SOAJ: 0.7500 mg | SUBCUTANEOUS | 0 refills | Status: DC

## 2024-01-16 NOTE — Assessment & Plan Note (Signed)
 Very uncontrolled with an A1c of 10.2.  Increase Jardiance  from 10 to 25 mg.  Start Trulicity  0.75 mg weekly with plans to escalate dose as quickly as tolerated.  Follow-up in 3 months.  Continue to work on lifestyle changes.  Insurance did not approve Mounjaro .

## 2024-01-16 NOTE — Assessment & Plan Note (Signed)
 Well-controlled on current.

## 2024-01-16 NOTE — Assessment & Plan Note (Signed)
Check levels next lab draw. 

## 2024-01-16 NOTE — Assessment & Plan Note (Signed)
 On atorvastatin  40 mg, check lipids with next lab draw.

## 2024-01-16 NOTE — Progress Notes (Signed)
 Established Patient Office Visit     CC/Reason for Visit: ED follow-up, follow-up chronic conditions  HPI: Chris Harrington is a 48 y.o. male who is coming in today for the above mentioned reasons. Past Medical History is significant for: Hypertension, type 2 diabetes, stage IIIb chronic kidney disease, hyperlipidemia, obesity, vitamin D  deficiency.  He had to visit the emergency department yesterday due to elevated glucose in the 400s.  He is on Jardiance  10 mg.  Insurance did not approve his Mounjaro .  His blood pressure is well-controlled on Diovan  HCT and carvedilol .   Past Medical/Surgical History: Past Medical History:  Diagnosis Date   Hypertension    Obesity     Past Surgical History:  Procedure Laterality Date   TONSILECTOMY, ADENOIDECTOMY, BILATERAL MYRINGOTOMY AND TUBES     pt has not had tubes in ears    Social History:  reports that he has never smoked. He has never used smokeless tobacco. He reports current alcohol use. He reports that he does not use drugs.  Allergies: Allergies  Allergen Reactions   Cranberry Swelling    Family History:  Family History  Problem Relation Age of Onset   Hypertension Mother    Diabetes Mother    Heart disease Mother    Stroke Mother    Hypertension Father    Cancer Paternal Grandmother        passed from unknown cancer   Hypertension Maternal Grandmother    Hypertension Maternal Grandfather      Current Outpatient Medications:    Accu-Chek Softclix Lancets lancets, Use once daily for glucose control with Accu Chek Aviva. DxE11.9., Disp: 100 each, Rfl: 12   atorvastatin  (LIPITOR) 40 MG tablet, Take 1 tablet (40 mg total) by mouth daily., Disp: 90 tablet, Rfl: 1   carvedilol  (COREG ) 25 MG tablet, Take 1 tablet (25 mg total) by mouth 2 (two) times daily with a meal., Disp: 180 tablet, Rfl: 1   Dulaglutide  (TRULICITY ) 0.75 MG/0.5ML SOAJ, Inject 0.75 mg into the skin once a week., Disp: 2 mL, Rfl: 0   empagliflozin   (JARDIANCE ) 25 MG TABS tablet, Take 1 tablet (25 mg total) by mouth daily., Disp: 90 tablet, Rfl: 1   valsartan -hydrochlorothiazide  (DIOVAN -HCT) 160-25 MG tablet, Take 1 tablet by mouth daily., Disp: 90 tablet, Rfl: 1  Review of Systems:  Negative unless indicated in HPI.   Physical Exam: Vitals:   01/16/24 1125  BP: 110/72  Pulse: 95  Temp: 98.6 F (37 C)  TempSrc: Oral  SpO2: 95%  Weight: 219 lb 3.2 oz (99.4 kg)  Height: 5' 2 (1.575 m)    Body mass index is 40.09 kg/m.   Physical Exam Vitals reviewed.  Constitutional:      Appearance: Normal appearance. He is obese.  HENT:     Head: Normocephalic and atraumatic.  Eyes:     Conjunctiva/sclera: Conjunctivae normal.  Cardiovascular:     Rate and Rhythm: Normal rate and regular rhythm.  Pulmonary:     Effort: Pulmonary effort is normal.     Breath sounds: Normal breath sounds.  Skin:    General: Skin is warm and dry.  Neurological:     General: No focal deficit present.     Mental Status: He is alert and oriented to person, place, and time.  Psychiatric:        Mood and Affect: Mood normal.        Behavior: Behavior normal.        Thought Content: Thought  content normal.        Judgment: Judgment normal.      Impression and Plan:  Type 2 diabetes mellitus without complication, without long-term current use of insulin  (HCC) Assessment & Plan: Very uncontrolled with an A1c of 10.2.  Increase Jardiance  from 10 to 25 mg.  Start Trulicity  0.75 mg weekly with plans to escalate dose as quickly as tolerated.  Follow-up in 3 months.  Continue to work on lifestyle changes.  Insurance did not approve Mounjaro .  Orders: -     POCT glycosylated hemoglobin (Hb A1C) -     Comprehensive metabolic panel with GFR; Future -     Magnesium ; Future -     Empagliflozin ; Take 1 tablet (25 mg total) by mouth daily.  Dispense: 90 tablet; Refill: 1 -     Trulicity ; Inject 0.75 mg into the skin once a week.  Dispense: 2 mL; Refill:  0 -     Ambulatory referral to Ophthalmology  Primary hypertension Assessment & Plan: Well-controlled on current.   Hyperlipidemia associated with type 2 diabetes mellitus (HCC) Assessment & Plan: On atorvastatin  40 mg, check lipids with next lab draw.  Orders: -     Lipid panel; Future  Vitamin D  deficiency Assessment & Plan: Check levels next lab draw.   Morbid obesity (HCC) Assessment & Plan: -Discussed healthy lifestyle, including increased physical activity and better food choices to promote weight loss.    Stage 3b chronic kidney disease (HCC) - Baseline  GFR is 42.        Time spent:34 minutes reviewing chart, interviewing and examining patient and formulating plan of care.     Chris Theophilus Andrews, MD [facility] at [facility]

## 2024-01-16 NOTE — Assessment & Plan Note (Signed)
 Discussed healthy lifestyle, including increased physical activity and better food choices to promote weight loss.

## 2024-01-18 ENCOUNTER — Telehealth: Payer: Self-pay

## 2024-01-18 ENCOUNTER — Other Ambulatory Visit (HOSPITAL_COMMUNITY): Payer: Self-pay

## 2024-01-18 ENCOUNTER — Encounter: Payer: Self-pay | Admitting: Internal Medicine

## 2024-01-18 NOTE — Telephone Encounter (Signed)
 Pharmacy Patient Advocate Encounter  Received notification from [facility] that Prior Authorization for Trulicity  0.75 has been APPROVED from 01/18/24 to 01/17/25. Ran test claim, Copay is $25.00. This test claim was processed through [facility]- copay amounts may vary at other pharmacies due to pharmacy/plan contracts, or as the patient moves through the different stages of their insurance plan.   PA #/Case ID/Reference #: # [id number]

## 2024-01-18 NOTE — Telephone Encounter (Signed)
 Pharmacy Patient Advocate Encounter   Received notification from Pt Calls Messages that prior authorization for Trulicity  0.75 is required/requested.   Insurance verification completed.   The patient is insured through [facility].   Per test claim: PA required; PA submitted to above mentioned insurance via Latent Key/confirmation #/EOC [id number] Status is pending

## 2024-01-23 ENCOUNTER — Other Ambulatory Visit

## 2024-02-13 ENCOUNTER — Other Ambulatory Visit: Payer: Self-pay | Admitting: Internal Medicine

## 2024-02-13 DIAGNOSIS — E119 Type 2 diabetes mellitus without complications: Secondary | ICD-10-CM

## 2024-04-08 ENCOUNTER — Other Ambulatory Visit: Payer: Self-pay | Admitting: Internal Medicine

## 2024-04-08 DIAGNOSIS — I10 Essential (primary) hypertension: Secondary | ICD-10-CM

## 2024-04-17 ENCOUNTER — Encounter: Payer: Self-pay | Admitting: Physician Assistant

## 2024-04-17 ENCOUNTER — Ambulatory Visit: Payer: Self-pay | Admitting: Internal Medicine

## 2024-04-17 ENCOUNTER — Encounter: Payer: Self-pay | Admitting: Internal Medicine

## 2024-04-17 VITALS — BP 110/70 | HR 80 | Temp 98.2°F | Wt 222.7 lb

## 2024-04-17 DIAGNOSIS — I10 Essential (primary) hypertension: Secondary | ICD-10-CM | POA: Diagnosis not present

## 2024-04-17 DIAGNOSIS — K921 Melena: Secondary | ICD-10-CM

## 2024-04-17 DIAGNOSIS — Z7984 Long term (current) use of oral hypoglycemic drugs: Secondary | ICD-10-CM

## 2024-04-17 DIAGNOSIS — E119 Type 2 diabetes mellitus without complications: Secondary | ICD-10-CM

## 2024-04-17 DIAGNOSIS — Z7985 Long-term (current) use of injectable non-insulin antidiabetic drugs: Secondary | ICD-10-CM | POA: Diagnosis not present

## 2024-04-17 DIAGNOSIS — E785 Hyperlipidemia, unspecified: Secondary | ICD-10-CM

## 2024-04-17 DIAGNOSIS — Z6841 Body Mass Index (BMI) 40.0 and over, adult: Secondary | ICD-10-CM

## 2024-04-17 DIAGNOSIS — N1832 Chronic kidney disease, stage 3b: Secondary | ICD-10-CM

## 2024-04-17 LAB — LIPID PANEL
Cholesterol: 129 mg/dL (ref 28–200)
HDL: 28.1 mg/dL — ABNORMAL LOW
LDL Cholesterol: 74 mg/dL (ref 10–99)
NonHDL: 101.04
Total CHOL/HDL Ratio: 5
Triglycerides: 133 mg/dL (ref 10.0–149.0)
VLDL: 26.6 mg/dL (ref 0.0–40.0)

## 2024-04-17 LAB — COMPREHENSIVE METABOLIC PANEL WITH GFR
ALT: 18 U/L (ref 3–53)
AST: 20 U/L (ref 5–37)
Albumin: 3.9 g/dL (ref 3.5–5.2)
Alkaline Phosphatase: 53 U/L (ref 39–117)
BUN: 24 mg/dL — ABNORMAL HIGH (ref 6–23)
CO2: 30 meq/L (ref 19–32)
Calcium: 9.2 mg/dL (ref 8.4–10.5)
Chloride: 99 meq/L (ref 96–112)
Creatinine, Ser: 1.82 mg/dL — ABNORMAL HIGH (ref 0.40–1.50)
GFR: 44.04 mL/min — ABNORMAL LOW
Glucose, Bld: 147 mg/dL — ABNORMAL HIGH (ref 70–99)
Potassium: 3 meq/L — ABNORMAL LOW (ref 3.5–5.1)
Sodium: 137 meq/L (ref 135–145)
Total Bilirubin: 0.7 mg/dL (ref 0.2–1.2)
Total Protein: 8.1 g/dL (ref 6.0–8.3)

## 2024-04-17 LAB — CBC WITH DIFFERENTIAL/PLATELET
Basophils Absolute: 0.1 K/uL (ref 0.0–0.1)
Basophils Relative: 1.2 % (ref 0.0–3.0)
Eosinophils Absolute: 0.2 K/uL (ref 0.0–0.7)
Eosinophils Relative: 2.8 % (ref 0.0–5.0)
HCT: 45 % (ref 39.0–52.0)
Hemoglobin: 15 g/dL (ref 13.0–17.0)
Lymphocytes Relative: 36.3 % (ref 12.0–46.0)
Lymphs Abs: 2 K/uL (ref 0.7–4.0)
MCHC: 33.4 g/dL (ref 30.0–36.0)
MCV: 89 fl (ref 78.0–100.0)
Monocytes Absolute: 0.5 K/uL (ref 0.1–1.0)
Monocytes Relative: 9 % (ref 3.0–12.0)
Neutro Abs: 2.7 K/uL (ref 1.4–7.7)
Neutrophils Relative %: 50.7 % (ref 43.0–77.0)
Platelets: 258 K/uL (ref 150.0–400.0)
RBC: 5.06 Mil/uL (ref 4.22–5.81)
RDW: 14.4 % (ref 11.5–15.5)
WBC: 5.4 K/uL (ref 4.0–10.5)

## 2024-04-17 LAB — POCT GLYCOSYLATED HEMOGLOBIN (HGB A1C): Hemoglobin A1C: 7.7 % — AB (ref 4.0–5.6)

## 2024-04-17 MED ORDER — ATORVASTATIN CALCIUM 40 MG PO TABS: 40.0000 mg | ORAL_TABLET | Freq: Every day | ORAL | 1 refills | Status: AC

## 2024-04-17 MED ORDER — TRULICITY 1.5 MG/0.5ML ~~LOC~~ SOAJ: 1.5000 mg | SUBCUTANEOUS | 0 refills | Status: AC

## 2024-04-17 NOTE — Assessment & Plan Note (Signed)
 Significantly improved with an A1c down to 7.7 from 10.2 in October.  Increase Trulicity  from 0.75 to 1.5 mg weekly.  Continue Jardiance  25 mg.

## 2024-04-17 NOTE — Assessment & Plan Note (Signed)
 Check repeat kidney function today, consider nephrology referral if worsening.

## 2024-04-17 NOTE — Progress Notes (Signed)
 "    Established Patient Office Visit     CC/Reason for Visit: Follow-up chronic conditions, discuss acute concern  HPI: Chris Harrington is a 49 y.o. male who is coming in today for the above mentioned reasons. Past Medical History is significant for: Hypertension, hyperlipidemia, type 2 diabetes, morbid obesity, chronic kidney disease stage IIIb with baseline creatinine of around 1.9 and a GFR of 42-44, vitamin D  deficiency.  He has been compliant with Trulicity  0.75 mg.  For the past 2 months has been noticing bright red blood per rectum about 3-4 times a week.  Small amounts.  Has never had a colonoscopy.   Past Medical/Surgical History: Past Medical History:  Diagnosis Date   Hypertension    Obesity     Past Surgical History:  Procedure Laterality Date   TONSILECTOMY, ADENOIDECTOMY, BILATERAL MYRINGOTOMY AND TUBES     pt has not had tubes in ears    Social History:  reports that he has never smoked. He has never used smokeless tobacco. He reports current alcohol use. He reports that he does not use drugs.  Allergies: Allergies[1]  Family History:  Family History  Problem Relation Age of Onset   Hypertension Mother    Diabetes Mother    Heart disease Mother    Stroke Mother    Hypertension Father    Cancer Paternal Grandmother        passed from unknown cancer   Hypertension Maternal Grandmother    Hypertension Maternal Grandfather     Current Medications[2]  Review of Systems:  Negative unless indicated in HPI.   Physical Exam: Vitals:   04/17/24 0954  BP: 110/70  Pulse: 80  Temp: 98.2 F (36.8 C)  TempSrc: Oral  SpO2: 98%  Weight: 222 lb 11.2 oz (101 kg)    Body mass index is 40.73 kg/m.   Physical Exam Vitals reviewed.  Constitutional:      Appearance: Normal appearance. He is obese.  HENT:     Head: Normocephalic and atraumatic.  Eyes:     Conjunctiva/sclera: Conjunctivae normal.  Cardiovascular:     Rate and Rhythm: Normal rate and  regular rhythm.  Pulmonary:     Effort: Pulmonary effort is normal.     Breath sounds: Normal breath sounds.  Skin:    General: Skin is warm and dry.  Neurological:     General: No focal deficit present.     Mental Status: He is alert and oriented to person, place, and time.  Psychiatric:        Mood and Affect: Mood normal.        Behavior: Behavior normal.        Thought Content: Thought content normal.        Judgment: Judgment normal.      Impression and Plan:  Type 2 diabetes mellitus without complication, without long-term current use of insulin  (HCC) Assessment & Plan: Significantly improved with an A1c down to 7.7 from 10.2 in October.  Increase Trulicity  from 0.75 to 1.5 mg weekly.  Continue Jardiance  25 mg.  Orders: -     POCT glycosylated hemoglobin (Hb A1C) -     Trulicity ; Inject 1.5 mg into the skin once a week.  Dispense: 6 mL; Refill: 0  Dyslipidemia Assessment & Plan: Check lipids today, goal less than 70 LDL.  Orders: -     Atorvastatin  Calcium ; Take 1 tablet (40 mg total) by mouth daily.  Dispense: 90 tablet; Refill: 1 -     Lipid  panel; Future  Morbid obesity (HCC) Assessment & Plan: -Discussed healthy lifestyle, including increased physical activity and better food choices to promote weight loss.    Stage 3b chronic kidney disease [facility]) Assessment & Plan: Check repeat kidney function today, consider nephrology referral if worsening.  Orders: -     Comprehensive metabolic panel with GFR; Future  Primary hypertension Assessment & Plan: Well-controlled on current.   Hematochezia -     Ambulatory referral to Gastroenterology -     CBC with Differential/Platelet; Future  -Urgent GI referral placed for consideration of colonoscopy due to hematochezia, check CBC today.   Time spent:33 minutes reviewing chart, interviewing and examining patient and formulating plan of care.     Tully Theophilus Andrews, MD [facility]  [facility]     [1]  Allergies Allergen Reactions   Cranberry Swelling  [2]  Current Outpatient Medications:    Accu-Chek Softclix Lancets lancets, Use once daily for glucose control with Accu Chek Aviva. DxE11.9., Disp: 100 each, Rfl: 12   carvedilol  (COREG ) 25 MG tablet, Take 1 tablet (25 mg total) by mouth 2 (two) times daily with a meal., Disp: 180 tablet, Rfl: 1   Dulaglutide  (TRULICITY ) 1.5 MG/0.5ML SOAJ, Inject 1.5 mg into the skin once a week., Disp: 6 mL, Rfl: 0   empagliflozin  (JARDIANCE ) 25 MG TABS tablet, Take 1 tablet (25 mg total) by mouth daily., Disp: 90 tablet, Rfl: 1   valsartan -hydrochlorothiazide  (DIOVAN -HCT) 160-25 MG tablet, TAKE 1 TABLET BY MOUTH DAILY, Disp: 90 tablet, Rfl: 1   atorvastatin  (LIPITOR) 40 MG tablet, Take 1 tablet (40 mg total) by mouth daily., Disp: 90 tablet, Rfl: 1  "

## 2024-04-17 NOTE — Assessment & Plan Note (Signed)
 Check lipids today, goal less than 70 LDL.

## 2024-04-17 NOTE — Assessment & Plan Note (Signed)
 Discussed healthy lifestyle, including increased physical activity and better food choices to promote weight loss.

## 2024-04-17 NOTE — Assessment & Plan Note (Signed)
 Well-controlled on current.

## 2024-04-23 ENCOUNTER — Telehealth: Payer: Self-pay | Admitting: *Deleted

## 2024-04-23 NOTE — Telephone Encounter (Signed)
 error

## 2024-05-02 ENCOUNTER — Ambulatory Visit: Payer: Self-pay | Admitting: Internal Medicine

## 2024-05-02 DIAGNOSIS — E876 Hypokalemia: Secondary | ICD-10-CM

## 2024-05-09 MED ORDER — POTASSIUM CHLORIDE CRYS ER 20 MEQ PO TBCR: 40.0000 meq | EXTENDED_RELEASE_TABLET | Freq: Every day | ORAL | 0 refills | Status: AC

## 2024-05-10 NOTE — Progress Notes (Unsigned)
 "  Chief Complaint: Hematochezia  HPI:    [name] is a 49 year old male with a past medical history as listed below including obesity and hypertension, who was referred to me by Theophilus Andrews, Tully GRADE, MD for a complaint of Chris Harrington.      04/17/2024 CBC normal.  CMP with a potassium low at 3, glucose 147, creatinine 1.82 (around baseline over the past 2 years).    04/17/2024 office visit with PCP and at that time discussed prior blood per rectum for about 3-4 times a week for the past 2 months.  Trulicity .  Past Medical History:  Diagnosis Date   Hypertension    Obesity     Past Surgical History:  Procedure Laterality Date   TONSILECTOMY, ADENOIDECTOMY, BILATERAL MYRINGOTOMY AND TUBES     pt has not had tubes in ears    Current Outpatient Medications  Medication Sig Dispense Refill   Accu-Chek Softclix Lancets lancets Use once daily for glucose control with Accu Chek Aviva. DxE11.9. 100 each 12   atorvastatin  (LIPITOR) 40 MG tablet Take 1 tablet (40 mg total) by mouth daily. 90 tablet 1   carvedilol  (COREG ) 25 MG tablet Take 1 tablet (25 mg total) by mouth 2 (two) times daily with a meal. 180 tablet 1   Dulaglutide  (TRULICITY ) 1.5 MG/0.5ML SOAJ Inject 1.5 mg into the skin once a week. 6 mL 0   empagliflozin  (JARDIANCE ) 25 MG TABS tablet Take 1 tablet (25 mg total) by mouth daily. 90 tablet 1   potassium chloride  SA (KLOR-CON  M) 20 MEQ tablet Take 2 tablets (40 mEq total) by mouth daily. 10 tablet 0   valsartan -hydrochlorothiazide  (DIOVAN -HCT) 160-25 MG tablet TAKE 1 TABLET BY MOUTH DAILY 90 tablet 1   No current facility-administered medications for this visit.    Allergies as of 05/11/2024 - Review Complete 04/17/2024  Allergen Reaction Noted   Cranberry Swelling 01/22/2020    Family History  Problem Relation Age of Onset   Hypertension Mother    Diabetes Mother    Heart disease Mother    Stroke Mother    Hypertension Father    Cancer Paternal Grandmother         passed from unknown cancer   Hypertension Maternal Grandmother    Hypertension Maternal Grandfather     Social History   Socioeconomic History   Marital status: Single    Spouse name: Not on file   Number of children: Not on file   Years of education: Not on file   Highest education level: Not on file  Occupational History   Not on file  Tobacco Use   Smoking status: Never   Smokeless tobacco: Never  Substance and Sexual Activity   Alcohol use: Yes    Comment: occasional   Drug use: No   Sexual activity: Yes  Other Topics Concern   Not on file  Social History Narrative   Not on file   Social Drivers of Health   Tobacco Use: Low Risk (04/17/2024)   Patient History    Smoking Tobacco Use: Never    Smokeless Tobacco Use: Never    Passive Exposure: Not on file  Financial Resource Strain: Not on file  Food Insecurity: Not on file  Transportation Needs: Not on file  Physical Activity: Not on file  Stress: Not on file  Social Connections: Not on file  Intimate Partner Violence: Not on file  Depression (PHQ2-9): Low Risk (08/08/2023)   Depression (PHQ2-9)    PHQ-2 Score:  1  Alcohol Screen: Not on file  Housing: Not on file  Utilities: Not on file  Health Literacy: Not on file    Review of Systems:    Constitutional: No weight loss, fever, chills, weakness or fatigue HEENT: Eyes: No change in vision               Ears, Nose, Throat:  No change in hearing or congestion Skin: No rash or itching Cardiovascular: No chest pain, chest pressure or palpitations   Respiratory: No SOB or cough Gastrointestinal: See HPI and otherwise negative Genitourinary: No dysuria or change in urinary frequency Neurological: No headache, dizziness or syncope Musculoskeletal: No new muscle or joint pain Hematologic: No bleeding or bruising Psychiatric: No history of depression or anxiety    Physical Exam:  Vital signs: There were no vitals taken for this visit.  Constitutional:    Pleasant Caucasian male appears to be in NAD, Well developed, Well nourished, alert and cooperative Head:  Normocephalic and atraumatic. Eyes:   PEERL, EOMI. No icterus. Conjunctiva pink. Ears:  Normal auditory acuity. Neck:  Supple Throat: Oral cavity and pharynx without inflammation, swelling or lesion.  Respiratory: Respirations even and unlabored. Lungs clear to auscultation bilaterally.   No wheezes, crackles, or rhonchi.  Cardiovascular: Normal S1, S2. No MRG. Regular rate and rhythm. No peripheral edema, cyanosis or pallor.  Gastrointestinal:  Soft, nondistended, nontender. No rebound or guarding. Normal bowel sounds. No appreciable masses or hepatomegaly. Rectal:  Not performed.  Msk:  Symmetrical without gross deformities. Without edema, no deformity or joint abnormality.  Neurologic:  Alert and  oriented x4;  grossly normal neurologically.  Skin:   Dry and intact without significant lesions or rashes. Psychiatric: Oriented to person, place and time. Demonstrates good judgement and reason without abnormal affect or behaviors.  RELEVANT LABS AND IMAGING: CBC    Component Value Date/Time   WBC 5.4 04/17/2024 1015   RBC 5.06 04/17/2024 1015   HGB 15.0 04/17/2024 1015   HGB 14.3 07/02/2019 1200   HCT 45.0 04/17/2024 1015   HCT 43.0 07/02/2019 1200   PLT 258.0 04/17/2024 1015   PLT 302 07/02/2019 1200   MCV 89.0 04/17/2024 1015   MCV 88 07/02/2019 1200   MCH 29.0 01/13/2024 0346   MCHC 33.4 04/17/2024 1015   RDW 14.4 04/17/2024 1015   RDW 13.7 07/02/2019 1200   LYMPHSABS 2.0 04/17/2024 1015   LYMPHSABS 2.0 07/02/2019 1200   MONOABS 0.5 04/17/2024 1015   EOSABS 0.2 04/17/2024 1015   EOSABS 0.3 07/02/2019 1200   BASOSABS 0.1 04/17/2024 1015   BASOSABS 0.1 07/02/2019 1200    CMP     Component Value Date/Time   NA 137 04/17/2024 1015   NA 140 07/02/2019 1200   K 3.0 (L) 04/17/2024 1015   CL 99 04/17/2024 1015   CO2 30 04/17/2024 1015   GLUCOSE 147 (H) 04/17/2024  1015   BUN 24 (H) 04/17/2024 1015   BUN 13 07/02/2019 1200   CREATININE 1.82 (H) 04/17/2024 1015   CREATININE 1.10 01/24/2020 1356   CALCIUM  9.2 04/17/2024 1015   PROT 8.1 04/17/2024 1015   PROT 7.6 07/02/2019 1200   ALBUMIN 3.9 04/17/2024 1015   ALBUMIN 4.0 07/02/2019 1200   AST 20 04/17/2024 1015   ALT 18 04/17/2024 1015   ALKPHOS 53 04/17/2024 1015   BILITOT 0.7 04/17/2024 1015   BILITOT 0.7 07/02/2019 1200   GFRNONAA 42 (L) 01/13/2024 0346   GFRAA 87 07/02/2019 1200  Assessment: 1. ***  Plan: 1. ***     Delon Failing, PA-C [facility] 05/10/2024, 11:48 AM  Cc: Theophilus Andrews, Tully GRADE, MD  "

## 2024-05-11 ENCOUNTER — Encounter: Payer: Self-pay | Admitting: Physician Assistant

## 2024-05-11 ENCOUNTER — Ambulatory Visit: Admitting: Physician Assistant

## 2024-05-11 VITALS — BP 116/78 | HR 78 | Ht 62.0 in | Wt 222.5 lb

## 2024-05-11 DIAGNOSIS — E876 Hypokalemia: Secondary | ICD-10-CM

## 2024-05-11 DIAGNOSIS — K921 Melena: Secondary | ICD-10-CM

## 2024-05-11 MED ORDER — NA SULFATE-K SULFATE-MG SULF 17.5-3.13-1.6 GM/177ML PO SOLN: 1.0000 | Freq: Once | ORAL | 0 refills | Status: AC

## 2024-05-11 NOTE — Patient Instructions (Addendum)
 Your provider has requested that you go to the basement level for lab work on Monday 06/25/24. Press B on the elevator. The lab is located at the first door on the left as you exit the elevator.  You have been scheduled for a colonoscopy. Please follow written instructions given to you at your visit today.   If you use inhalers (even only as needed), please bring them with you on the day of your procedure.  DO NOT TAKE 7 DAYS PRIOR TO TEST- Trulicity  (dulaglutide ) Ozempic, Wegovy (semaglutide) Mounjaro , Zepbound  (tirzepatide ) Bydureon Bcise (exanatide extended release)  DO NOT TAKE 1 DAY PRIOR TO YOUR TEST Rybelsus (semaglutide) Adlyxin (lixisenatide) Victoza (liraglutide) Byetta (exanatide) ___________________________________________________________________________

## 2024-06-29 ENCOUNTER — Encounter: Admitting: Gastroenterology
